# Patient Record
Sex: Male | Born: 1935 | Hispanic: Yes | Marital: Married
Health system: Southern US, Community
[De-identification: ages and names within clinical notes are randomized; demographics above are authoritative.]

---

## 2005-02-01 ENCOUNTER — Ambulatory Visit: Payer: Self-pay | Admitting: Gastroenterology

## 2008-04-08 ENCOUNTER — Emergency Department: Payer: Self-pay | Admitting: Internal Medicine

## 2010-08-30 ENCOUNTER — Ambulatory Visit: Payer: Self-pay | Admitting: Internal Medicine

## 2012-06-26 ENCOUNTER — Ambulatory Visit: Payer: Self-pay | Admitting: Gastroenterology

## 2012-06-30 LAB — PATHOLOGY REPORT

## 2013-09-14 ENCOUNTER — Ambulatory Visit: Payer: Self-pay | Admitting: Urology

## 2013-09-14 LAB — BASIC METABOLIC PANEL
Anion Gap: 8 (ref 7–16)
BUN: 25 mg/dL — ABNORMAL HIGH (ref 7–18)
CALCIUM: 9 mg/dL (ref 8.5–10.1)
CO2: 28 mmol/L (ref 21–32)
Chloride: 104 mmol/L (ref 98–107)
Creatinine: 1.24 mg/dL (ref 0.60–1.30)
EGFR (Non-African Amer.): 55 — ABNORMAL LOW
Glucose: 127 mg/dL — ABNORMAL HIGH (ref 65–99)
OSMOLALITY: 285 (ref 275–301)
Potassium: 4.6 mmol/L (ref 3.5–5.1)
Sodium: 140 mmol/L (ref 136–145)

## 2013-09-14 LAB — CBC
HCT: 37.1 % — ABNORMAL LOW (ref 40.0–52.0)
HGB: 12.4 g/dL — ABNORMAL LOW (ref 13.0–18.0)
MCH: 30.5 pg (ref 26.0–34.0)
MCHC: 33.4 g/dL (ref 32.0–36.0)
MCV: 91 fL (ref 80–100)
Platelet: 260 10*3/uL (ref 150–440)
RBC: 4.07 10*6/uL — AB (ref 4.40–5.90)
RDW: 13.1 % (ref 11.5–14.5)
WBC: 8 10*3/uL (ref 3.8–10.6)

## 2013-09-27 ENCOUNTER — Ambulatory Visit: Payer: Self-pay | Admitting: Urology

## 2014-05-28 NOTE — Op Note (Signed)
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   PROCEDURE: KTP laser prostatectomy.   SURGEON: Lorraine Laxichard D Sedonia Kitner, MD   ANESTHESIA: General.   COMPLICATIONS: None.   BLOOD LOSS: Zero.  DESCRIPTION OF PROCEDURE: With the patient sterilely prepped and draped in supine lithotomy position for ease of approach to the external genitalia and after an appropriate timeout, we do a cystoscopy, find trilobar hypertrophy, identify the verumontanum and the bladder neck. There is a prominent middle lobe with lateral lobe hypertrophy extending to the verumontanum. The verumontanum is easily seen. So, I used 146.864 joules with watts of 80 and 120 and lasing time is 23 minutes and 12 seconds. The middle lobe was done first, then the lateral lobes all the way to the verumontanum. This gland is perfect for KTP, it is very vascular and it easily absorbs the laser energy and beautifully necrosis. There is a beautiful opening at the bladder neck. Upon crede at the end of procedure after the scope has been withdrawn, I have an excellent stream. He has clear urine, but I do leave a Foley in. I was very meticulous in making sure we did not put any energy beyond the verumontanum. There was 1 small bleeder in the whole case and that was easily destroyed by the KTP laser on both coag and necrosing settings. At the end of the procedure, the bladder is emptied by utilizing a 20 French Foley; 30 mL of Marcaine 0.5% plain are placed in the bladder, a B and O suppository in the rectal. Rectal exam reveals no tumors, masses or growths. Probably I can discontinue the Foley before his discharge, as there is no bleeding.     ____________________________ Caralyn Guileichard D. Edwyna ShellHart, DO rdh:TT D: 09/27/2013 12:33:13 ET T: 09/27/2013 21:59:25 ET JOB#: [id number]  cc: Caralyn Guileichard D. Edwyna ShellHart, DO, <Dictator> Maleigh Bagot D Seann Genther DO ELECTRONICALLY SIGNED 10/01/2013 14:15

## 2019-03-08 ENCOUNTER — Inpatient Hospital Stay (HOSPITAL_COMMUNITY)
Admit: 2019-03-08 | Discharge: 2019-03-08 | Disposition: A | Discharge status: Home / Self Care | Payer: Medicare Other | Attending: Critical Care Medicine | Admitting: Critical Care Medicine

## 2019-03-08 ENCOUNTER — Other Ambulatory Visit: Payer: Self-pay

## 2019-03-08 ENCOUNTER — Inpatient Hospital Stay
Admission: EM | Admit: 2019-03-08 | Discharge: 2019-04-05 | DRG: 208 | Disposition: E | Payer: Medicare Other | Source: Skilled Nursing Facility | Attending: Internal Medicine | Admitting: Internal Medicine

## 2019-03-08 ENCOUNTER — Emergency Department: Payer: Medicare Other

## 2019-03-08 ENCOUNTER — Inpatient Hospital Stay: Payer: Medicare Other

## 2019-03-08 DIAGNOSIS — R627 Adult failure to thrive: Secondary | ICD-10-CM | POA: Diagnosis present

## 2019-03-08 DIAGNOSIS — J189 Pneumonia, unspecified organism: Secondary | ICD-10-CM | POA: Diagnosis not present

## 2019-03-08 DIAGNOSIS — Z95828 Presence of other vascular implants and grafts: Secondary | ICD-10-CM

## 2019-03-08 DIAGNOSIS — R1310 Dysphagia, unspecified: Secondary | ICD-10-CM | POA: Diagnosis present

## 2019-03-08 DIAGNOSIS — T17918D Gastric contents in respiratory tract, part unspecified causing other injury, subsequent encounter: Secondary | ICD-10-CM | POA: Diagnosis not present

## 2019-03-08 DIAGNOSIS — Z20822 Contact with and (suspected) exposure to covid-19: Secondary | ICD-10-CM | POA: Diagnosis present

## 2019-03-08 DIAGNOSIS — I495 Sick sinus syndrome: Secondary | ICD-10-CM | POA: Diagnosis present

## 2019-03-08 DIAGNOSIS — Z79899 Other long term (current) drug therapy: Secondary | ICD-10-CM

## 2019-03-08 DIAGNOSIS — A419 Sepsis, unspecified organism: Secondary | ICD-10-CM

## 2019-03-08 DIAGNOSIS — R339 Retention of urine, unspecified: Secondary | ICD-10-CM | POA: Diagnosis present

## 2019-03-08 DIAGNOSIS — G9389 Other specified disorders of brain: Secondary | ICD-10-CM | POA: Diagnosis present

## 2019-03-08 DIAGNOSIS — J9601 Acute respiratory failure with hypoxia: Principal | ICD-10-CM

## 2019-03-08 DIAGNOSIS — R791 Abnormal coagulation profile: Secondary | ICD-10-CM | POA: Diagnosis present

## 2019-03-08 DIAGNOSIS — G4700 Insomnia, unspecified: Secondary | ICD-10-CM | POA: Diagnosis present

## 2019-03-08 DIAGNOSIS — Z66 Do not resuscitate: Secondary | ICD-10-CM | POA: Diagnosis present

## 2019-03-08 DIAGNOSIS — I10 Essential (primary) hypertension: Secondary | ICD-10-CM | POA: Diagnosis present

## 2019-03-08 DIAGNOSIS — R4182 Altered mental status, unspecified: Secondary | ICD-10-CM | POA: Diagnosis not present

## 2019-03-08 DIAGNOSIS — E1151 Type 2 diabetes mellitus with diabetic peripheral angiopathy without gangrene: Secondary | ICD-10-CM | POA: Diagnosis present

## 2019-03-08 DIAGNOSIS — I69398 Other sequelae of cerebral infarction: Secondary | ICD-10-CM

## 2019-03-08 DIAGNOSIS — Z515 Encounter for palliative care: Secondary | ICD-10-CM | POA: Diagnosis present

## 2019-03-08 DIAGNOSIS — I69391 Dysphagia following cerebral infarction: Secondary | ICD-10-CM

## 2019-03-08 DIAGNOSIS — Z888 Allergy status to other drugs, medicaments and biological substances status: Secondary | ICD-10-CM

## 2019-03-08 DIAGNOSIS — R6520 Severe sepsis without septic shock: Secondary | ICD-10-CM

## 2019-03-08 DIAGNOSIS — G9349 Other encephalopathy: Secondary | ICD-10-CM | POA: Diagnosis present

## 2019-03-08 DIAGNOSIS — I69351 Hemiplegia and hemiparesis following cerebral infarction affecting right dominant side: Secondary | ICD-10-CM | POA: Diagnosis not present

## 2019-03-08 DIAGNOSIS — Z794 Long term (current) use of insulin: Secondary | ICD-10-CM | POA: Diagnosis not present

## 2019-03-08 DIAGNOSIS — L89896 Pressure-induced deep tissue damage of other site: Secondary | ICD-10-CM | POA: Diagnosis present

## 2019-03-08 DIAGNOSIS — L89159 Pressure ulcer of sacral region, unspecified stage: Secondary | ICD-10-CM | POA: Diagnosis present

## 2019-03-08 DIAGNOSIS — Z950 Presence of cardiac pacemaker: Secondary | ICD-10-CM

## 2019-03-08 DIAGNOSIS — Z87440 Personal history of urinary (tract) infections: Secondary | ICD-10-CM

## 2019-03-08 DIAGNOSIS — Z7189 Other specified counseling: Secondary | ICD-10-CM | POA: Diagnosis not present

## 2019-03-08 DIAGNOSIS — Z86711 Personal history of pulmonary embolism: Secondary | ICD-10-CM

## 2019-03-08 DIAGNOSIS — I959 Hypotension, unspecified: Secondary | ICD-10-CM | POA: Diagnosis present

## 2019-03-08 DIAGNOSIS — Z86718 Personal history of other venous thrombosis and embolism: Secondary | ICD-10-CM

## 2019-03-08 DIAGNOSIS — J9600 Acute respiratory failure, unspecified whether with hypoxia or hypercapnia: Secondary | ICD-10-CM | POA: Diagnosis present

## 2019-03-08 DIAGNOSIS — J690 Pneumonitis due to inhalation of food and vomit: Secondary | ICD-10-CM | POA: Diagnosis present

## 2019-03-08 DIAGNOSIS — Z7982 Long term (current) use of aspirin: Secondary | ICD-10-CM | POA: Diagnosis not present

## 2019-03-08 DIAGNOSIS — Z6823 Body mass index (BMI) 23.0-23.9, adult: Secondary | ICD-10-CM

## 2019-03-08 DIAGNOSIS — J9690 Respiratory failure, unspecified, unspecified whether with hypoxia or hypercapnia: Secondary | ICD-10-CM

## 2019-03-08 DIAGNOSIS — Z7901 Long term (current) use of anticoagulants: Secondary | ICD-10-CM

## 2019-03-08 LAB — COMPREHENSIVE METABOLIC PANEL
ALT: 26 U/L (ref 0–44)
AST: 41 U/L (ref 15–41)
Albumin: 2.2 g/dL — ABNORMAL LOW (ref 3.5–5.0)
Alkaline Phosphatase: 86 U/L (ref 38–126)
Anion gap: 7 (ref 5–15)
BUN: 25 mg/dL — ABNORMAL HIGH (ref 8–23)
CO2: 22 mmol/L (ref 22–32)
Calcium: 7.6 mg/dL — ABNORMAL LOW (ref 8.9–10.3)
Chloride: 114 mmol/L — ABNORMAL HIGH (ref 98–111)
Creatinine, Ser: 1.24 mg/dL (ref 0.61–1.24)
GFR calc Af Amer: >60 mL/min (ref >60)
GFR calc non Af Amer: 53 mL/min — ABNORMAL LOW (ref >60)
Glucose, Bld: 214 mg/dL — ABNORMAL HIGH (ref 70–99)
Potassium: 3.6 mmol/L (ref 3.5–5.1)
Sodium: 143 mmol/L (ref 135–145)
Total Bilirubin: 0.5 mg/dL (ref 0.3–1.2)
Total Protein: 6.2 g/dL — ABNORMAL LOW (ref 6.5–8.1)

## 2019-03-08 LAB — BLOOD GAS, ARTERIAL
Acid-base deficit: 2 mmol/L (ref 0.0–2.0)
Bicarbonate: 22.3 mmol/L (ref 20.0–28.0)
FIO2: 50
MECHVT: 500 mL
Mechanical Rate: 14
O2 Saturation: 98.3 %
PEEP: 5 cmH2O
Patient temperature: 37
pCO2 arterial: 36 mmHg (ref 32.0–48.0)
pH, Arterial: 7.4 (ref 7.350–7.450)
pO2, Arterial: 111 mmHg — ABNORMAL HIGH (ref 83.0–108.0)

## 2019-03-08 LAB — CBC WITH DIFFERENTIAL/PLATELET
Abs Immature Granulocytes: 0.07 10*3/uL (ref 0.00–0.07)
Basophils Absolute: 0.1 10*3/uL (ref 0.0–0.1)
Basophils Relative: 0 %
Eosinophils Absolute: 0 10*3/uL (ref 0.0–0.5)
Eosinophils Relative: 0 %
HCT: 34.5 % — ABNORMAL LOW (ref 39.0–52.0)
Hemoglobin: 11.4 g/dL — ABNORMAL LOW (ref 13.0–17.0)
Immature Granulocytes: 1 %
Lymphocytes Relative: 10 %
Lymphs Abs: 1.5 10*3/uL (ref 0.7–4.0)
MCH: 29.6 pg (ref 26.0–34.0)
MCHC: 33 g/dL (ref 30.0–36.0)
MCV: 89.6 fL (ref 80.0–100.0)
Monocytes Absolute: 1.3 10*3/uL — ABNORMAL HIGH (ref 0.1–1.0)
Monocytes Relative: 9 %
Neutro Abs: 12.5 10*3/uL — ABNORMAL HIGH (ref 1.7–7.7)
Neutrophils Relative %: 80 %
Platelets: 321 10*3/uL (ref 150–400)
RBC: 3.85 MIL/uL — ABNORMAL LOW (ref 4.22–5.81)
RDW: 13.7 % (ref 11.5–15.5)
WBC: 15.5 10*3/uL — ABNORMAL HIGH (ref 4.0–10.5)
nRBC: 0 % (ref 0.0–0.2)

## 2019-03-08 LAB — MRSA PCR SCREENING: MRSA by PCR: NEGATIVE

## 2019-03-08 LAB — C-REACTIVE PROTEIN: CRP: 24.5 mg/dL — ABNORMAL HIGH (ref <1.0)

## 2019-03-08 LAB — PROTIME-INR
INR: 3.4 — ABNORMAL HIGH (ref 0.8–1.2)
Prothrombin Time: 34.4 seconds — ABNORMAL HIGH (ref 11.4–15.2)

## 2019-03-08 LAB — GLUCOSE, CAPILLARY
Glucose-Capillary: 239 mg/dL — ABNORMAL HIGH (ref 70–99)
Glucose-Capillary: 258 mg/dL — ABNORMAL HIGH (ref 70–99)
Glucose-Capillary: 231 mg/dL — ABNORMAL HIGH (ref 70–99)
Glucose-Capillary: 173 mg/dL — ABNORMAL HIGH (ref 70–99)
Glucose-Capillary: 185 mg/dL — ABNORMAL HIGH (ref 70–99)

## 2019-03-08 LAB — RESPIRATORY PANEL BY RT PCR (FLU A&B, COVID)
Influenza A by PCR: NEGATIVE
Influenza B by PCR: NEGATIVE
SARS Coronavirus 2 by RT PCR: NEGATIVE

## 2019-03-08 LAB — FIBRIN DERIVATIVES D-DIMER (ARMC ONLY): Fibrin derivatives D-dimer (ARMC): 1518.45 ng/mL (FEU) — ABNORMAL HIGH (ref 0.00–499.00)

## 2019-03-08 LAB — BRAIN NATRIURETIC PEPTIDE: B Natriuretic Peptide: 534 pg/mL — ABNORMAL HIGH (ref 0.0–100.0)

## 2019-03-08 LAB — LACTIC ACID, PLASMA
Lactic Acid, Venous: 1 mmol/L (ref 0.5–1.9)
Lactic Acid, Venous: 2.2 mmol/L (ref 0.5–1.9)

## 2019-03-08 LAB — APTT: aPTT: 64 seconds — ABNORMAL HIGH (ref 24–36)

## 2019-03-08 LAB — HEMOGLOBIN A1C
Hgb A1c MFr Bld: 8.1 % — ABNORMAL HIGH (ref 4.8–5.6)
Mean Plasma Glucose: 185.77 mg/dL

## 2019-03-08 LAB — TROPONIN I (HIGH SENSITIVITY): Troponin I (High Sensitivity): 3219 ng/L (ref <18)

## 2019-03-08 LAB — FERRITIN: Ferritin: 259 ng/mL (ref 24–336)

## 2019-03-08 LAB — PROCALCITONIN: Procalcitonin: 0.34 ng/mL

## 2019-03-08 LAB — POC SARS CORONAVIRUS 2 AG: SARS Coronavirus 2 Ag: NEGATIVE

## 2019-03-08 MED ORDER — SODIUM CHLORIDE 0.9 % IV SOLN
2.0000 g | Freq: Once | INTRAVENOUS | Status: AC
  Administered 2019-03-08: 04:00:00 2 g via INTRAVENOUS
  Filled 2019-03-08: qty 2

## 2019-03-08 MED ORDER — ATORVASTATIN CALCIUM 20 MG PO TABS
80.0000 mg | ORAL_TABLET | Freq: Every evening | ORAL | Status: DC
  Administered 2019-03-08 – 2019-03-09 (×2): 80 mg
  Filled 2019-03-08 (×2): qty 4

## 2019-03-08 MED ORDER — METRONIDAZOLE IN NACL 5-0.79 MG/ML-% IV SOLN
500.0000 mg | Freq: Once | INTRAVENOUS | Status: AC
  Administered 2019-03-08: 500 mg via INTRAVENOUS
  Filled 2019-03-08: qty 100

## 2019-03-08 MED ORDER — INSULIN ASPART 100 UNIT/ML ~~LOC~~ SOLN
0.0000 [IU] | SUBCUTANEOUS | Status: DC
  Administered 2019-03-08: 8 [IU] via SUBCUTANEOUS
  Administered 2019-03-08 (×2): 3 [IU] via SUBCUTANEOUS
  Administered 2019-03-09: 2 [IU] via SUBCUTANEOUS
  Administered 2019-03-09: 5 [IU] via SUBCUTANEOUS
  Administered 2019-03-09: 3 [IU] via SUBCUTANEOUS
  Administered 2019-03-09: 17:00:00 2 [IU] via SUBCUTANEOUS
  Administered 2019-03-09: 3 [IU] via SUBCUTANEOUS
  Filled 2019-03-08 (×9): qty 1

## 2019-03-08 MED ORDER — METOPROLOL TARTRATE 25 MG PO TABS: 12.5000 mg | ORAL_TABLET | Freq: Four times a day (QID) | ORAL | Status: DC

## 2019-03-08 MED ORDER — CHLORHEXIDINE GLUCONATE CLOTH 2 % EX PADS
6.0000 | MEDICATED_PAD | Freq: Every day | CUTANEOUS | Status: DC
  Administered 2019-03-08 – 2019-03-13 (×5): 6 via TOPICAL

## 2019-03-08 MED ORDER — LIDOCAINE HCL URETHRAL/MUCOSAL 2 % EX GEL
1.0000 "application " | Freq: Once | CUTANEOUS | Status: AC
  Administered 2019-03-08: 1 via URETHRAL
  Filled 2019-03-08: qty 5

## 2019-03-08 MED ORDER — DEXAMETHASONE SODIUM PHOSPHATE 10 MG/ML IJ SOLN
10.0000 mg | Freq: Once | INTRAMUSCULAR | Status: AC
  Administered 2019-03-08: 10 mg via INTRAVENOUS
  Filled 2019-03-08: qty 1

## 2019-03-08 MED ORDER — ROCURONIUM BROMIDE 50 MG/5ML IV SOLN
120.0000 mg | Freq: Once | INTRAVENOUS | Status: AC
  Administered 2019-03-08: 120 mg via INTRAVENOUS

## 2019-03-08 MED ORDER — ASPIRIN 81 MG PO CHEW: 81.0000 mg | CHEWABLE_TABLET | Freq: Every day | ORAL | Status: DC

## 2019-03-08 MED ORDER — ATORVASTATIN CALCIUM 20 MG PO TABS: 80.0000 mg | ORAL_TABLET | Freq: Every evening | ORAL | Status: DC

## 2019-03-08 MED ORDER — IPRATROPIUM-ALBUTEROL 0.5-2.5 (3) MG/3ML IN SOLN: 3.0000 mL | Freq: Four times a day (QID) | RESPIRATORY_TRACT | Status: DC | PRN

## 2019-03-08 MED ORDER — LACTATED RINGERS IV SOLN: INTRAVENOUS | Status: DC

## 2019-03-08 MED ORDER — FENTANYL CITRATE (PF) 100 MCG/2ML IJ SOLN
12.5000 ug | INTRAMUSCULAR | Status: DC | PRN
  Administered 2019-03-09 (×2): 12.5 ug via INTRAVENOUS
  Filled 2019-03-08 (×2): qty 2

## 2019-03-08 MED ORDER — METOPROLOL TARTRATE 25 MG PO TABS: 12.5000 mg | ORAL_TABLET | Freq: Two times a day (BID) | ORAL | Status: DC

## 2019-03-08 MED ORDER — ETOMIDATE 2 MG/ML IV SOLN
30.0000 mg | Freq: Once | INTRAVENOUS | Status: AC
  Administered 2019-03-08: 30 mg via INTRAVENOUS

## 2019-03-08 MED ORDER — FAMOTIDINE IN NACL 20-0.9 MG/50ML-% IV SOLN
20.0000 mg | Freq: Every day | INTRAVENOUS | Status: DC
  Administered 2019-03-08 – 2019-03-11 (×4): 20 mg via INTRAVENOUS
  Filled 2019-03-08 (×4): qty 50

## 2019-03-08 MED ORDER — ORAL CARE MOUTH RINSE
15.0000 mL | OROMUCOSAL | Status: DC
  Administered 2019-03-08 – 2019-03-13 (×47): 15 mL via OROMUCOSAL

## 2019-03-08 MED ORDER — ASPIRIN 81 MG PO CHEW
81.0000 mg | CHEWABLE_TABLET | Freq: Every day | ORAL | Status: DC
  Administered 2019-03-09 – 2019-03-11 (×3): 81 mg
  Filled 2019-03-08 (×3): qty 1

## 2019-03-08 MED ORDER — VANCOMYCIN HCL IN DEXTROSE 1-5 GM/200ML-% IV SOLN
1000.0000 mg | Freq: Once | INTRAVENOUS | Status: AC
  Administered 2019-03-08: 1000 mg via INTRAVENOUS
  Filled 2019-03-08: qty 200

## 2019-03-08 MED ORDER — ONDANSETRON HCL 4 MG/2ML IJ SOLN: 4.0000 mg | Freq: Four times a day (QID) | INTRAMUSCULAR | Status: DC | PRN

## 2019-03-08 MED ORDER — LACTATED RINGERS IV BOLUS
1000.0000 mL | Freq: Once | INTRAVENOUS | Status: AC
  Administered 2019-03-08: 04:00:00 1000 mL via INTRAVENOUS

## 2019-03-08 MED ORDER — PROPOFOL 1000 MG/100ML IV EMUL
5.0000 ug/kg/min | INTRAVENOUS | Status: DC
  Administered 2019-03-08: 02:00:00 5 ug/kg/min via INTRAVENOUS
  Administered 2019-03-08 (×2): 25 ug/kg/min via INTRAVENOUS
  Filled 2019-03-08 (×2): qty 100

## 2019-03-08 MED ORDER — SODIUM CHLORIDE 0.9 % IV SOLN
2.0000 g | Freq: Two times a day (BID) | INTRAVENOUS | Status: DC
  Administered 2019-03-08 – 2019-03-11 (×6): 2 g via INTRAVENOUS
  Filled 2019-03-08 (×8): qty 2

## 2019-03-08 MED ORDER — LACTATED RINGERS IV BOLUS (SEPSIS)
1000.0000 mL | Freq: Once | INTRAVENOUS | Status: AC
  Administered 2019-03-08: 1000 mL via INTRAVENOUS

## 2019-03-08 MED ORDER — METHYLPREDNISOLONE SODIUM SUCC 40 MG IJ SOLR
40.0000 mg | Freq: Two times a day (BID) | INTRAMUSCULAR | Status: DC
  Administered 2019-03-08 – 2019-03-11 (×7): 40 mg via INTRAVENOUS
  Filled 2019-03-08 (×7): qty 1

## 2019-03-08 MED ORDER — CHLORHEXIDINE GLUCONATE 0.12% ORAL RINSE (MEDLINE KIT)
15.0000 mL | Freq: Two times a day (BID) | OROMUCOSAL | Status: DC
  Administered 2019-03-08: 15 mL via OROMUCOSAL

## 2019-03-08 NOTE — Progress Notes (Signed)
CODE SEPSIS - PHARMACY COMMUNICATION  **Broad Spectrum Antibiotics should be administered within 1 hour of Sepsis diagnosis**  Time Code Sepsis Called/Page Received: 0148  Antibiotics Ordered: vanc/cefepime/flagyl  Time of 1st antibiotic administration: 0238 (vanc/flagyl)  Additional action taken by pharmacy: called RN to see status of cefepime and RN had stated that patient needed a new line to run additional meds. Vanc/flagyl were given first because RN did not realize to give cefepime first.  If necessary, Name of Provider/Nurse Contacted:     Thomasene Ripple ,PharmD Clinical Pharmacist  03/24/2019  3:22 AM

## 2019-03-08 NOTE — ED Notes (Signed)
RT at bedside suctioning pt at this time. Pt waking up some but able to talk to pt and he is now resting with eyes closed.

## 2019-03-08 NOTE — Progress Notes (Signed)
Inpatient Diabetes Program Recommendations  AACE/ADA: New Consensus Statement on Inpatient Glycemic Control (2015)  Target Ranges:  Prepandial:   less than 140 mg/dL      Peak postprandial:   less than 180 mg/dL (1-2 hours)      Critically ill patients:  140 - 180 mg/dL   Lab Results  Component Value Date   GLUCAP 231 (H) 03/14/2019   HGBA1C 8.1 (H) 03/18/2019    Review of Glycemic Control Results for Russell Patrick, Russell Patrick (MRN [id number]) as of 03/28/2019 11:19  Ref. Range 03/23/2019 06:55 03/28/2019 08:35 04/04/2019 10:17  Glucose-Capillary Latest Ref Range: 70 - 99 mg/dL 301 (H) 314 (H) 388 (H)   Diabetes history: DM 2 Outpatient Diabetes medications: Glipizide 10 mg bid, Lantus 18 units qevening, Regular insulin 0-10 units 4x/day Current orders for Inpatient glycemic control:  Novolog 0-15 units Q4 hours  Solumedrol 40 mg Q12 hours  Inpatient Diabetes Program Recommendations:    Pt may benefit from Levemir 5-6 units bid.  Thanks,  Christena Deem RN, MSN, BC-ADM Inpatient Diabetes Coordinator Team Pager [phone] (8a-5p)

## 2019-03-08 NOTE — ED Notes (Addendum)
Pt intubated by Md jessup.

## 2019-03-08 NOTE — ED Notes (Signed)
Per off going RN Mac pt is waiting for ICU bed at this time. Pt still needs urine and waiting on urology to come and insert foley. Mac states several attempts with no success.

## 2019-03-08 NOTE — Progress Notes (Signed)
*  PRELIMINARY RESULTS* Echocardiogram 2D Echocardiogram has been performed.  Cristela Blue 03/23/2019, 2:26 PM

## 2019-03-08 NOTE — H&P (Signed)
Name: Russell Patrick MRN: [id number] DOB: 1932-10-16    ADMISSION DATE:  03/14/2019 CONSULTATION DATE: 03/08/2019  REFERRING MD : Dr. Charna Archer   CHIEF COMPLAINT: Shortness of Breath   BRIEF PATIENT DESCRIPTION:  86 yo male admitted with acute hypoxic respiratory failure suspected secondary to pneumonia and acute encephalopathy due to severe hypoxia requiring mechanical intubation   SIGNIFICANT EVENTS/STUDIES:  02/1: Pt admitted to ICU with acute hypoxic respiratory failure requiring mechanical intubation   HISTORY OF PRESENT ILLNESS:   This is an 86 yo male with a PMH of CVA with Right Sided Deficits, DVT/PE requiring IVC filter, PAD, Insomnia, HTN, Recurrent UTI's, Type II Diabetes Mellitus, Recurrent Syncopal Episodes, and Pacemaker.  He presented to [facility] ER via EMS from [facility] on 02/1 with hypoxia, O2 sats in the low 80's. Per ER notes according to the facility pt tested positive for COVID-19 via rapid test on 01/31. EMS reported upon their arrival pt hypoxic requiring NRB and he also received 500 ml fluid bolus due to inability to obtain blood pressure en route to the ER.  It was also reported pt febrile with temp of 103 F. Upon arrival to the ER pt obtunded, tachycardic, and tachypneic requiring mechanical intubation.  Lab results revealed glucose 214, BNP 534, lactic acid 1.0, pct 0.34, wbc 15.5, hgb 11.4, PT 34.4, and INR 3.4. Influenza PCR and COVID-19 negative, however CXR concerning for bibasilar infiltrates.  Pt received cefepime, decadron, flagyl, vancomycin, and 2L LR bolus.  He was subsequently admitted to ICU for additional workup and treatment.    PAST MEDICAL HISTORY :   has no past medical history on file.  has no past surgical history on file. Prior to Admission medications   Medication Sig Start Date End Date Taking? Authorizing Provider  acetaminophen (TYLENOL) 500 MG tablet Take 1,000 mg by mouth every 6 (six) hours as needed.   Yes [provider]    aspirin 81 MG chewable tablet Chew 81 mg by mouth daily.  02/09/19 03/11/19 Yes [provider]  atorvastatin (LIPITOR) 80 MG tablet Take 1 tablet by mouth every evening. 02/09/19 03/11/19 Yes [provider]  clotrimazole-betamethasone (LOTRISONE) cream Apply 1 application topically 2 (two) times daily.  10/16/18  Yes [provider]  escitalopram (LEXAPRO) 10 MG tablet Take 10 mg by mouth daily. 10/26/18  Yes [provider]  famotidine (PEPCID) 20 MG tablet Take 20 mg by mouth at bedtime.   Yes [provider]  glipiZIDE (GLUCOTROL XL) 10 MG 24 hr tablet Take 10 mg by mouth 2 (two) times daily. 11/07/18  Yes [provider]  insulin glargine (LANTUS) 100 UNIT/ML injection Inject 18 Units into the skin every evening. 02/08/19 03/10/19 Yes [provider]  lidocaine (XYLOCAINE) 5 % ointment Apply 1 application topically 3 (three) times daily.  11/27/18  Yes [provider]  lisinopril (ZESTRIL) 10 MG tablet Take 1 tablet by mouth daily. 02/09/19 03/11/19 Yes [provider]  Melatonin 3 MG TABS Take 1 tablet by mouth at bedtime. 02/08/19  Yes [provider]  metoprolol tartrate (LOPRESSOR) 25 MG tablet Take 12.5 mg by mouth every 6 (six) hours. 02/08/19 03/10/19 Yes [provider]  pantoprazole (PROTONIX) 40 MG tablet Take 40 mg by mouth every morning. 11/30/18  Yes [provider]  polyethylene glycol powder (GLYCOLAX/MIRALAX) 17 GM/SCOOP powder Take 17 g by mouth daily. Mix in 4 oz fluid 02/09/19 03/11/19 Yes [provider]  senna (SENOKOT) 8.6 MG tablet Take  1 tablet by mouth daily as needed. 02/08/19 03/10/19 Yes [provider]  silver sulfADIAZINE (SILVADENE) 1 % cream Apply 1 application topically daily. Apply a small amount topically to open areas daily 04/13/18 04/13/19 Yes [provider]  traZODone (DESYREL) 50 MG tablet Take 50 mg by mouth at bedtime. 11/10/18  Yes [provider]  XARELTO 20 MG TABS tablet Take 20 mg by mouth every evening. With meal 12/21/18  Yes [provider]  insulin regular (NOVOLIN R) 100 units/mL injection Inject 0-10 Units into the skin 4 (four) times daily -  before meals and at bedtime. Per sliding scale; If blood sugar is less than 60, call MD. If blood sugar is 0 to 150, give 0 units. If blood sugar is 151 to 250, give 2 units. If blood sugar is 251 to 300, give 4 units. If blood sugar is 301 to 350, give 6 units. If blood sugar is 351 to 400, give 8 units. If blood sugar is 401 to 450, give 10 units. If blood sugar is greater than 450, call MD. (if BS>350, give 10 units and recheck in 2 hours, call MD)    [provider]   Allergies  Allergen Reactions  . Statins     FAMILY HISTORY:  family history is not on file. SOCIAL HISTORY:    REVIEW OF SYSTEMS:   Unable to assess pt intubated   SUBJECTIVE:  Unable to assess pt intubated   VITAL SIGNS: Temp:  [99.2 F (37.3 C)] 99.2 F (37.3 C) (02/01 0225) Pulse Rate:  [98-134] 98 (02/01 0330) Resp:  [17-38] 19 (02/01 0330) BP: (154-175)/(74-111) 161/74 (02/01 0330) SpO2:  [98 %-100 %] 100 % (02/01 0330) FiO2 (%):  [50 %] 50 % (02/01 0149) Weight:  [82 kg] 82 kg (02/01 0149)  PHYSICAL EXAMINATION: General: acutely ill appearing male, NAD mechanically intubated Neuro: sedated, not following commands  HEENT: supple, no JVD Cardiovascular: sinus tach, BBB, no R/G Lungs: rhonchi throughout, even, non labored  Abdomen: +BS x4, obese, soft, non tender, non distended Musculoskeletal: normal bulk and tone, no edema Skin: intact no rashes or lesions   Recent Labs  Lab 03/09/2019 0148  NA 143  K 3.6  CL 114*  CO2 22  BUN 25*  CREATININE 1.24  GLUCOSE 214*   Recent Labs  Lab 04/02/2019 0148  HGB 11.4*  HCT 34.5*  WBC 15.5*  PLT 321   DG Chest Port 1 View  Result Date: 04/04/2019 CLINICAL DATA:  Check endotracheal tube placement EXAM: PORTABLE CHEST 1 VIEW  COMPARISON:  09/14/2013 FINDINGS: Cardiac shadow is within normal limits. Endotracheal tube is seen in satisfactory position. Gastric catheter is noted with the tip in the stomach although the proximal side port lies at the GE junction. This could be advanced several cm further into the stomach. Lungs demonstrate patchy bibasilar infiltrates left greater than right. No bony abnormality is seen. IMPRESSION: Bibasilar infiltrates. Tubes and lines as described. Electronically Signed   By: Inez Catalina M.D.   On: 04/04/2019 02:18    ASSESSMENT / PLAN:  Acute hypoxic respiratory failure secondary to pneumonia  Mechanical Intubation  Full vent support for now-vent settings reviewed and established  SBT once parameters met  VAP bundle implemented  IV steroids wean as tolerated  Prn bronchodilator therapy   Essential HTN  Hx: DVT/PE requiring IVC filter, CVA with Right Sided Deficits, Pacemaker, and Recurrent Syncopal Episodes  Continuous telemetry monitoring  Will check troponin  Echo pending  Continue outpatient metoprolol, aspirin, and atorvastatin  Hold outpatient xarelto, metoprolol, and lisinopril for now   Leukocytosis secondary to pneumonia  Trend WBC and monitor fever curve Trend PCT  Follow cultures  Continue cefepime   Urinary retention  Urology consulted to place foley catheter due to unsuccessful placement by nursing staff appreciate input   Type II Diabetes Mellitus  CBG's q4hrs  SSI   Acute encephalopathy likely secondary to hypoxia  Mechanical intubation pain/discomfort Maintain RASS goal -1 to -2 Propofol gtt and prn fentanyl to maintain RASS goal  WUA daily  CT Head pending   Best Practice: VTE px: SCD's, will hold chemical prophylaxis for now due to elevated PT/INR  SUP px: iv famotidine Diet: keep NPO for now if pt remains intubated in the next 24hrs will start tube feeds   Marda Stalker, [place] Pager [phone] (please enter 7  digits) PCCM Consult Pager [phone] (please enter 7 digits)

## 2019-03-08 NOTE — ED Notes (Signed)
Pt given 30 etomidate by Melina Copa

## 2019-03-08 NOTE — Progress Notes (Signed)
PHARMACY -  BRIEF ANTIBIOTIC NOTE   Pharmacy has received consult(s) for vanc/cefepime from an ED provider.  The patient's profile has been reviewed for ht/wt/allergies/indication/available labs.    One time order(s) placed for vancomycin 2g IV load (vanc 1g + 1g)  Further antibiotics/pharmacy consults should be ordered by admitting physician if indicated.                       Thank you,  Thomasene Ripple, PharmD, BCPS Clinical Pharmacist 03/15/2019  3:26 AM

## 2019-03-08 NOTE — Progress Notes (Signed)
Transported to ct scan without incident.

## 2019-03-08 NOTE — ED Notes (Addendum)
ET tube 25 at the lip, verified by CO2 color change.

## 2019-03-08 NOTE — ED Notes (Signed)
Admit MD Jayme Cloud informed of troponin level.

## 2019-03-08 NOTE — ED Provider Notes (Signed)
[facility] Emergency Department Provider Note   ____________________________________________   First MD Initiated Contact with Patient 03/13/2019 0145     (approximate)  I have reviewed the triage vital signs and the nursing notes.   HISTORY  Chief Complaint Respiratory Distress    HPI Russell Patrick is a 86 y.o. male with past medical history of stroke with right-sided deficits, hypertension, diabetes, PAD presents to the ED for respiratory distress.  History is limited due to patient's diminished mental status.  Patient resides [facility] and was noted to have increasing trouble breathing as well as a fever earlier today.  He reportedly had a rapid Covid test at the facility that returned positive.  EMS was called due to worsening difficulty breathing, and upon arrival he was found to be in respiratory distress with O2 sat in the low 80s.  He was subsequently placed on nonrebreather at 15 L and had improvement in O2 sats to 90%.  He was noted to be tachycardic, tachypneic, and febrile to 103.  EMS had difficulty obtaining a blood pressure and he was given a fluid bolus of 500 cc prior to arrival.        History reviewed. No pertinent past medical history.  Patient Active Problem List   Diagnosis Date Noted  . Acute respiratory failure (HCC) 04/02/2019    History reviewed. No pertinent surgical history.  Prior to Admission medications   Medication Sig Start Date End Date Taking? Authorizing Provider  acetaminophen (TYLENOL) 500 MG tablet Take 1,000 mg by mouth every 6 (six) hours as needed.   Yes [provider]  aspirin 81 MG chewable tablet Chew 1 tablet by mouth daily. 02/09/19 03/11/19 Yes [provider]  atorvastatin (LIPITOR) 80 MG tablet Take 1 tablet by mouth every evening. 02/09/19 03/11/19 Yes [provider]  famotidine (PEPCID) 20 MG tablet Take 20 mg by mouth at bedtime.   Yes [provider]  insulin  glargine (LANTUS) 100 UNIT/ML injection Inject 18 Units into the skin every evening. 02/08/19 03/10/19 Yes [provider]  lisinopril (ZESTRIL) 10 MG tablet Take 1 tablet by mouth daily. 02/09/19 03/11/19 Yes [provider]  Melatonin 3 MG TABS Take 1 tablet by mouth at bedtime. 02/08/19  Yes [provider]  metoprolol tartrate (LOPRESSOR) 25 MG tablet Take 12.5 mg by mouth every 6 (six) hours. 02/08/19 03/10/19 Yes [provider]  polyethylene glycol powder (GLYCOLAX/MIRALAX) 17 GM/SCOOP powder Take 17 g by mouth daily. Mix in 4 oz fluid 02/09/19 03/11/19 Yes [provider]  clotrimazole-betamethasone (LOTRISONE) cream APPLY CREAM TOPICALLY TWICE DAILY 10/16/18   [provider]  escitalopram (LEXAPRO) 10 MG tablet Take 10 mg by mouth daily. 10/26/18   [provider]  lidocaine (XYLOCAINE) 5 % ointment APPLY OINTMENT TOPICALLY THREE TIMES DAILY 11/27/18   [provider]  pantoprazole (PROTONIX) 40 MG tablet Take 40 mg by mouth every morning. 11/30/18   [provider]    Allergies Patient has no allergy information on record.  History reviewed. No pertinent family history.  Social History Social History   Tobacco Use  . Smoking status: Unknown If Ever Smoked  Substance Use Topics  . Alcohol use: Not on file  . Drug use: Not on file    Review of Systems Unable to obtain secondary to altered mental status  ____________________________________________   PHYSICAL EXAM:  VITAL SIGNS: ED Triage Vitals [03/21/2019 0136]  Enc Vitals Group     BP  Pulse Rate (!) 101     Resp (!) 24     Temp      Temp src      SpO2 100 %     Weight      Height      Head Circumference      Peak Flow      Pain Score      Pain Loc      Pain Edu?      Excl. in GC?     Constitutional: Obtunded. Eyes: Conjunctivae are normal.  Pupils equal round and reactive to light bilaterally. Head: Atraumatic. Nose: No  congestion/rhinnorhea. Mouth/Throat: Mucous membranes are moist. Neck: Normal ROM Cardiovascular: Tachycardic, regular rhythm. Grossly normal heart sounds. Respiratory: Tachypneic with accessory muscle use, crackles at bilateral bases. Gastrointestinal: Soft and nondistended. Genitourinary: deferred Musculoskeletal: No lower extremity tenderness nor edema. Neurologic: Does not open eyes or respond to voice.  Withdraws slightly from pain on left with no response to pain on right. Skin:  Skin is warm, dry and intact. No rash noted.  ____________________________________________   LABS (all labs ordered are listed, but only abnormal results are displayed)  Labs Reviewed  COMPREHENSIVE METABOLIC PANEL - Abnormal; Notable for the following components:      Result Value   Chloride 114 (*)    Glucose, Bld 214 (*)    BUN 25 (*)    Calcium 7.6 (*)    Total Protein 6.2 (*)    Albumin 2.2 (*)    GFR calc non Af Amer 53 (*)    All other components within normal limits  CBC WITH DIFFERENTIAL/PLATELET - Abnormal; Notable for the following components:   WBC 15.5 (*)    RBC 3.85 (*)    Hemoglobin 11.4 (*)    HCT 34.5 (*)    Neutro Abs 12.5 (*)    Monocytes Absolute 1.3 (*)    All other components within normal limits  APTT - Abnormal; Notable for the following components:   aPTT 64 (*)    All other components within normal limits  PROTIME-INR - Abnormal; Notable for the following components:   Prothrombin Time 34.4 (*)    INR 3.4 (*)    All other components within normal limits  BRAIN NATRIURETIC PEPTIDE - Abnormal; Notable for the following components:   B Natriuretic Peptide 534.0 (*)    All other components within normal limits  BLOOD GAS, ARTERIAL - Abnormal; Notable for the following components:   pO2, Arterial 111 (*)    All other components within normal limits  RESPIRATORY PANEL BY RT PCR (FLU A&B, COVID)  CULTURE, BLOOD (ROUTINE X 2)  CULTURE, BLOOD (ROUTINE X 2)  URINE  CULTURE  RESPIRATORY PANEL BY RT PCR (FLU A&B, COVID)  LACTIC ACID, PLASMA  PROCALCITONIN  LACTIC ACID, PLASMA  URINALYSIS, ROUTINE W REFLEX MICROSCOPIC  C-REACTIVE PROTEIN  FERRITIN  FIBRIN DERIVATIVES D-DIMER ([facility] ONLY)  POC SARS CORONAVIRUS 2 AG -  ED  POC SARS CORONAVIRUS 2 AG   ____________________________________________  EKG  ED ECG REPORT I, Chesley Noon, the attending physician, personally viewed and interpreted this ECG.   Date: 03/17/2019  EKG Time: 1:37  Rate: 99  Rhythm: normal sinus rhythm  Axis: LAD  Intervals:right bundle branch block and left anterior fascicular block  ST&T Change: None  ED ECG REPORT I, Chesley Noon, the attending physician, personally viewed and interpreted this ECG.   Date: Mar 17, 2019  EKG Time: 1:49  Rate: 121  Rhythm: sinus tachycardia  Axis: LAD  Intervals:right bundle branch block and left anterior fascicular block  ST&T Change: None    PROCEDURES  Procedure(s) performed (including Critical Care):  .Critical Care Performed by: Chesley Noon, MD Authorized by: Chesley Noon, MD   Critical care provider statement:    Critical care time (minutes):  45   Critical care time was exclusive of:  Separately billable procedures and treating other patients and teaching time   Critical care was necessary to treat or prevent imminent or life-threatening deterioration of the following conditions:  Respiratory failure   Critical care was time spent personally by me on the following activities:  Discussions with consultants, evaluation of patient's response to treatment, examination of patient, ordering and performing treatments and interventions, ordering and review of laboratory studies, ordering and review of radiographic studies, pulse oximetry, re-evaluation of patient's condition, obtaining history from patient or surrogate and review of old charts   I assumed direction of critical care for this patient from another provider  in my specialty: no   Procedure Name: Intubation Date/Time: 2019/03/19 3:42 AM Performed by: Chesley Noon, MD Pre-anesthesia Checklist: Patient identified, Patient being monitored, Emergency Drugs available, Timeout performed and Suction available Oxygen Delivery Method: Non-rebreather mask Preoxygenation: Pre-oxygenation with 100% oxygen Induction Type: Rapid sequence Ventilation: Mask ventilation without difficulty Laryngoscope Size: Glidescope and 4 Grade View: Grade I Tube size: 7.5 mm Number of attempts: 1 Airway Equipment and Method: Video-laryngoscopy Placement Confirmation: ETT inserted through vocal cords under direct vision,  CO2 detector and Breath sounds checked- equal and bilateral Secured at: 25 cm Tube secured with: ETT holder Dental Injury: Teeth and Oropharynx as per pre-operative assessment         ____________________________________________   INITIAL IMPRESSION / ASSESSMENT AND PLAN / ED COURSE       86 year old male with history of stroke and right-sided deficits, hypertension, diabetes presents to the ED for altered mental status and respiratory distress.  He was noted to have O2 sats in the 80s prior to arrival and was subsequently placed on nonrebreather.  Upon arrival in the ED, he is obtunded with no response to voice and minimal response to pain, GCS less than 8.  He is also in respiratory distress with tachypnea and accessory muscle use.  Decision was made to intubate and this was done without difficulty.  Given reported fever as well as tachycardia and tachypnea here, I am concerned for sepsis and he was started on broad-spectrum antibiotics.  COVID-19 testing pending.  His heart rate gradually improved with IV fluid administration.  Patient noted to have leukocytosis as well as elevated procalcitonin, consistent with sepsis.  COVID-19 testing thus far is negative.  There is evidence of pneumonia on chest x-ray and in talking with patient's son, he has  a history of recurrent UTI.  Unfortunately there has been difficulty placing a Foley catheter.  Coud catheter was placed, however there was no return of urine following placement despite flushing and catheter subsequently removed.  Assistance of urology may be needed for placement of catheter.  Case discussed with ICU team, who accepts patient for admission.      ____________________________________________   FINAL CLINICAL IMPRESSION(S) / ED DIAGNOSES  Final diagnoses:  Sepsis with acute hypoxic respiratory failure without septic shock, due to unspecified organism [facility])  Community acquired pneumonia, unspecified laterality  Altered mental status, unspecified altered mental status type     ED Discharge Orders    None       Note:  This document  was prepared using Systems analyst and may include unintentional dictation errors.   Blake Divine, MD 03-23-2019 [phone]

## 2019-03-08 NOTE — ED Notes (Addendum)
Verbal order for 30 Etomidate and 120 rocc from Md jessup.

## 2019-03-08 NOTE — ED Notes (Signed)
Pt bladder scanned. >254mL

## 2019-03-08 NOTE — Progress Notes (Signed)
   25-Mar-2019 2000  Clinical Encounter Type  Visited With Patient;Family  Visit Type Initial  Referral From Nurse  Consult/Referral To Chaplain  Spiritual Encounters  Spiritual Needs Prayer  Stress Factors  Patient Stress Factors Health changes;Major life changes  Family Stress Factors Loss;Loss of control;Major life changes  Chaplain visited with patient and family. Patient is transitioning to comfort care and family asked for prayer. Chaplain offered prayer to family for God's will to be done and for strength for the family per their request.  End of Visit.

## 2019-03-08 NOTE — Progress Notes (Signed)
Pharmacy Antibiotic Note  Russell Patrick is a 86 y.o. male admitted on 03-13-19 with sepsis s/t pneumonia meeting 3/4 SIRS w/ elevated PCT of 0.34 in the setting of normal renal fx.  Pharmacy has been consulted for cefepime dosing. Patient received cefepime 2g, vanc 2g load, and flagyl 500 mg IV in the ED.  Plan: Will continue patient on cefepime 2g IV q12h per CrCl 30 - 60 ml/min and will continue to monitor and adjust doses as necessary.  Height: 6' (182.9 cm) Weight: 180 lb 12.4 oz (82 kg) IBW/kg (Calculated) : 77.6  Temp (24hrs), Avg:99.2 F (37.3 C), Min:99.2 F (37.3 C), Max:99.2 F (37.3 C)  Recent Labs  Lab 03/13/19 0148  WBC 15.5*  CREATININE 1.24  LATICACIDVEN 1.0    Estimated Creatinine Clearance: 48.7 mL/min (by C-G formula based on SCr of 1.24 mg/dL).    Allergies  Allergen Reactions  . Statins     Thank you for allowing pharmacy to be a part of this patient's care.  Thomasene Ripple, PharmD, BCPS Clinical Pharmacist March 13, 2019 5:13 AM

## 2019-03-08 NOTE — Progress Notes (Signed)
*  PRELIMINARY RESULTS* Echocardiogram 2D Echocardiogram has been performed.  Russell Patrick Apr 06, 2019, 2:18 PM

## 2019-03-08 NOTE — ED Notes (Signed)
Attempted to call floor for pt's room assignment. RN to call back

## 2019-03-08 NOTE — ED Notes (Addendum)
Swabbed pt for MRSA test. Pt became a little arousable. Pt started moving his arms and coughing. Pt opened his eyes. Talked to pt and informed him what was going on and where he was.  Propofol adjusted and rate increased.  CBG checked and is 258. Will give missed does of insulin at this time according to scale.Pt now resting.

## 2019-03-08 NOTE — Consult Note (Signed)
PHARMACY CONSULT NOTE - FOLLOW UP  Pharmacy Consult for Electrolyte Monitoring and Replacement   Russell Patrick is an 86 y.o. male who was admitted on 05-Apr-2019 with respiratory distress, hypoxic, and fever. EMS reports according to the facility, pt is COVID positive (01/31). However, SARS Coronavirus 2 by PCR was negative. PMH includes stroke with right-sided deficits, HTN, diabetes, PAD. Pt is intubated and on propofol IV. Pharmacy is consulted to assist with monitoring and replacing electrolytes.   Recent Labs: Potassium (mmol/L)  Date Value  April 05, 2019 3.6  09/14/2013 4.6   Calcium (mg/dL)  Date Value  58/52/7782 7.6 (L)   Calcium, Total (mg/dL)  Date Value  42/35/3614 9.0   Albumin (g/dL)  Date Value  43/15/4008 2.2 (L)   Sodium (mmol/L)  Date Value  April 05, 2019 143  09/14/2013 140     Assessment: 1. Electrolytes: Electrolytes are WNL. Corrected Calcium is 9 (WNL). Patient received LR boluses x 2 doses today. Continue to monitor electrolytes in am labs. Will replace to achieve goal for potassium ~4, magnesium ~2, and sodium ~140.   2. Glucose: A1c: 8.1. Pt has a hx of DM. Glucose-capillary monitored Q2H. Today's range 173-258. Pt has been started on SSI insulin aspart 0-15 units [place] Q4H today (11 units received so far today). Pt also received 1 dose of dexamethasone 10mg  IV today. In addition, pt was started on methylprednisolone 40mg  Q12H today (duration so far: 1 day).   3. Constipation: Last BM: PTA. Will continue to monitor and add constipation-relief meds as necessary.     ,PharmD Candidate 04/05/19 1:56 PM

## 2019-03-08 NOTE — ED Notes (Signed)
Pt satting at 100% on NRB

## 2019-03-08 NOTE — ED Notes (Addendum)
Pt given 120 rocc by sarah rn

## 2019-03-08 NOTE — ED Notes (Signed)
Date and time results received: 03/13/2019 0907 (use smartphrase ".now" to insert current time)  Test: troponin Critical Value: 3219  Name of Provider Notified: Jayme Cloud  Orders Received? Or Actions Taken?: Orders Received - See Orders for details

## 2019-03-08 NOTE — ED Notes (Addendum)
Pt had saturated brief. Pt cleaned up and clean brief applied. Pt pulled up in bed and now resting comfortably.

## 2019-03-08 NOTE — ED Triage Notes (Addendum)
Pt here via [facility] from [facility]. Pt found to have sats in the low 80's, pt tested covid positive today.  Pt satting with EMS at 90% on 15L NRB, HR ST 130, axillary temp 103, RR 40, unable to read a blood pressure. Pt with wet lung sounds on arrival.  Pt received NS on route.   Pt's baseline unknown, known right sided stroke.

## 2019-03-09 ENCOUNTER — Inpatient Hospital Stay: Payer: Medicare Other

## 2019-03-09 DIAGNOSIS — Z515 Encounter for palliative care: Secondary | ICD-10-CM

## 2019-03-09 DIAGNOSIS — T17918D Gastric contents in respiratory tract, part unspecified causing other injury, subsequent encounter: Secondary | ICD-10-CM

## 2019-03-09 DIAGNOSIS — Z7189 Other specified counseling: Secondary | ICD-10-CM

## 2019-03-09 DIAGNOSIS — J189 Pneumonia, unspecified organism: Secondary | ICD-10-CM

## 2019-03-09 LAB — RENAL FUNCTION PANEL
Albumin: 2.2 g/dL — ABNORMAL LOW (ref 3.5–5.0)
Anion gap: 12 (ref 5–15)
BUN: 34 mg/dL — ABNORMAL HIGH (ref 8–23)
CO2: 22 mmol/L (ref 22–32)
Calcium: 8.5 mg/dL — ABNORMAL LOW (ref 8.9–10.3)
Chloride: 109 mmol/L (ref 98–111)
Creatinine, Ser: 1.34 mg/dL — ABNORMAL HIGH (ref 0.61–1.24)
GFR calc Af Amer: 56 mL/min — ABNORMAL LOW (ref >60)
GFR calc non Af Amer: 48 mL/min — ABNORMAL LOW (ref >60)
Glucose, Bld: 203 mg/dL — ABNORMAL HIGH (ref 70–99)
Phosphorus: 3.8 mg/dL (ref 2.5–4.6)
Potassium: 3.7 mmol/L (ref 3.5–5.1)
Sodium: 143 mmol/L (ref 135–145)

## 2019-03-09 LAB — PROTIME-INR
INR: 2 — ABNORMAL HIGH (ref 0.8–1.2)
Prothrombin Time: 22.5 seconds — ABNORMAL HIGH (ref 11.4–15.2)

## 2019-03-09 LAB — CBC
HCT: 35.1 % — ABNORMAL LOW (ref 39.0–52.0)
Hemoglobin: 11.1 g/dL — ABNORMAL LOW (ref 13.0–17.0)
MCH: 28.8 pg (ref 26.0–34.0)
MCHC: 31.6 g/dL (ref 30.0–36.0)
MCV: 91.2 fL (ref 80.0–100.0)
Platelets: 335 10*3/uL (ref 150–400)
RBC: 3.85 MIL/uL — ABNORMAL LOW (ref 4.22–5.81)
RDW: 13.9 % (ref 11.5–15.5)
WBC: 26.3 10*3/uL — ABNORMAL HIGH (ref 4.0–10.5)
nRBC: 0 % (ref 0.0–0.2)

## 2019-03-09 LAB — MAGNESIUM: Magnesium: 2.1 mg/dL (ref 1.7–2.4)

## 2019-03-09 LAB — GLUCOSE, CAPILLARY
Glucose-Capillary: 118 mg/dL — ABNORMAL HIGH (ref 70–99)
Glucose-Capillary: 121 mg/dL — ABNORMAL HIGH (ref 70–99)
Glucose-Capillary: 126 mg/dL — ABNORMAL HIGH (ref 70–99)
Glucose-Capillary: 145 mg/dL — ABNORMAL HIGH (ref 70–99)
Glucose-Capillary: 152 mg/dL — ABNORMAL HIGH (ref 70–99)
Glucose-Capillary: 172 mg/dL — ABNORMAL HIGH (ref 70–99)
Glucose-Capillary: 211 mg/dL — ABNORMAL HIGH (ref 70–99)

## 2019-03-09 MED ORDER — FENTANYL BOLUS VIA INFUSION
12.5000 ug | INTRAVENOUS | Status: DC | PRN
  Administered 2019-03-09: 12.5 ug via INTRAVENOUS
  Filled 2019-03-09: qty 13

## 2019-03-09 MED ORDER — IPRATROPIUM BROMIDE 0.02 % IN SOLN
0.5000 mg | Freq: Four times a day (QID) | RESPIRATORY_TRACT | Status: DC
  Administered 2019-03-09 – 2019-03-11 (×8): 0.5 mg via RESPIRATORY_TRACT
  Filled 2019-03-09 (×8): qty 2.5

## 2019-03-09 MED ORDER — GLYCOPYRROLATE 0.2 MG/ML IJ SOLN
0.4000 mg | Freq: Four times a day (QID) | INTRAMUSCULAR | Status: DC
  Administered 2019-03-09 – 2019-03-10 (×4): 0.4 mg via INTRAVENOUS
  Filled 2019-03-09 (×4): qty 2

## 2019-03-09 MED ORDER — ORAL CARE MOUTH RINSE: 15.0000 mL | OROMUCOSAL | Status: DC

## 2019-03-09 MED ORDER — DEXMEDETOMIDINE HCL IN NACL 400 MCG/100ML IV SOLN
0.4000 ug/kg/h | INTRAVENOUS | Status: DC
  Administered 2019-03-09: 0.4 ug/kg/h via INTRAVENOUS
  Filled 2019-03-09: qty 100

## 2019-03-09 MED ORDER — INSULIN ASPART 100 UNIT/ML ~~LOC~~ SOLN
0.0000 [IU] | SUBCUTANEOUS | Status: DC
  Administered 2019-03-09 – 2019-03-11 (×6): 2 [IU] via SUBCUTANEOUS
  Filled 2019-03-09 (×8): qty 1

## 2019-03-09 MED ORDER — CHLORHEXIDINE GLUCONATE 0.12% ORAL RINSE (MEDLINE KIT)
15.0000 mL | Freq: Two times a day (BID) | OROMUCOSAL | Status: DC
  Administered 2019-03-09 – 2019-03-16 (×15): 15 mL via OROMUCOSAL
  Filled 2019-03-09 (×2): qty 15

## 2019-03-09 MED ORDER — FENTANYL 2500MCG IN NS 250ML (10MCG/ML) PREMIX INFUSION
0.0000 ug/h | INTRAVENOUS | Status: DC
  Administered 2019-03-09: 6.25 ug/h via INTRAVENOUS
  Administered 2019-03-10: 100 ug/h via INTRAVENOUS
  Filled 2019-03-09 (×2): qty 250

## 2019-03-09 NOTE — Consult Note (Signed)
Consultation Note Date: 03/09/2019   Patient Name: Russell Patrick  DOB: 1932/11/27  MRN: [id number]  Age / Sex: 86 y.o., male  PCP: Marisa Hua, MD Referring Physician: Tyler Pita, MD  Reason for Consultation: Establishing goals of care  HPI/Patient Profile: 86 y.o. male  with past medical history of recent left ischemic stroke with right sided deficits, PE/DVT requiring IVC filter, PAD, insomnia, HTN, recurrent UTI's, type 2 diabetes, sick sinus syndrome with pacemaker admitted on 03/13/2019 from [facility] with shortness of breath, hypoxia, fever with pneumonia and requiring intubation. Overall failure to thrive with significant decline since recent stroke.   Clinical Assessment and Goals of Care: I met today at Russell Patrick bedside with his son and daughter. Discussed care with Dr. Patsey Berthold and RN this morning. Son and daughter have recently spoken with Dr. Patsey Berthold for update. They understand that his prognosis is very poor at this stage and he has much declined since his past stroke. They understand that we are concerned with his swallow function and ability to protect his airway. They are trusting the medical team to let them know the right time to remove ventilator and are most concerned that he is comfortable and does not suffer. I told them that we do not expect that he will do well with extubation even though it is hard to know what this would look like as far as if he will live hours or days but we will ensure his comfort. I also explained that the most important thing to do is to discuss as a family to ensure everyone who needs to is able to be here or say goodbye (visitor policy allows 4 visitors). They will discuss with their mother and siblings who will need to be here. They are trying to decide if they want to be at bedside during this process or remember their father the way he was and not at end  of life. I will reach out to them tomorrow to plan one way extubation.   All questions/concerns addressed. Emotional support provided.   Primary Decision Maker NEXT OF KIN wife is elderly and relies on her children for decisions    SUMMARY OF RECOMMENDATIONS   - One way extubation most likely for tomorrow - If further decline prior to extubation family would like to be notified but do not desire escalation of care  Code Status/Advance Care Planning:  DNR   Symptom Management:   Secretions copious: Scheduled Robinul in preparation for one way extubation.   Agitation: limited d/t to low BP. RN reports that he does well with fentanyl but it does not last him long. Will order very low dose fentanyl infusion and monitor BP but comfort if ultimate goal here. Lower BP would be tolerated given plan of care moving towards comfort care.   Palliative Prophylaxis:   Aspiration, Delirium Protocol, Oral Care and Turn Reposition  Psycho-social/Spiritual:   Desire for further Chaplaincy support:yes  Additional Recommendations: Grief/Bereavement Support  Prognosis:   Hours - Days after extubation  Discharge  Planning: To Be Determined      Primary Diagnoses: Present on Admission: . Acute respiratory failure ([place])   I have reviewed the medical record, interviewed the patient and family, and examined the patient. The following aspects are pertinent.  History reviewed. No pertinent past medical history. Social History   Socioeconomic History  . Marital status: Married    Spouse name: Not on file  . Number of children: Not on file  . Years of education: Not on file  . Highest education level: Not on file  Occupational History  . Not on file  Tobacco Use  . Smoking status: Unknown If Ever Smoked  Substance and Sexual Activity  . Alcohol use: Not on file  . Drug use: Not on file  . Sexual activity: Not on file  Other Topics Concern  . Not on file  Social History Narrative    . Not on file   Social Determinants of Health   Financial Resource Strain:   . Difficulty of Paying Living Expenses: Not on file  Food Insecurity:   . Worried About Charity fundraiser in the Last Year: Not on file  . Ran Out of Food in the Last Year: Not on file  Transportation Needs:   . Lack of Transportation (Medical): Not on file  . Lack of Transportation (Non-Medical): Not on file  Physical Activity:   . Days of Exercise per Week: Not on file  . Minutes of Exercise per Session: Not on file  Stress:   . Feeling of Stress : Not on file  Social Connections:   . Frequency of Communication with Friends and Family: Not on file  . Frequency of Social Gatherings with Friends and Family: Not on file  . Attends Religious Services: Not on file  . Active Member of Clubs or Organizations: Not on file  . Attends Archivist Meetings: Not on file  . Marital Status: Not on file   History reviewed. No pertinent family history. Scheduled Meds: . aspirin  81 mg Per Tube Daily  . atorvastatin  80 mg Per Tube QPM  . chlorhexidine gluconate (MEDLINE KIT)  15 mL Mouth Rinse BID  . Chlorhexidine Gluconate Cloth  6 each Topical Daily  . insulin aspart  0-15 Units Subcutaneous Q4H  . mouth rinse  15 mL Mouth Rinse 10 times per day  . methylPREDNISolone (SOLU-MEDROL) injection  40 mg Intravenous Q12H   Continuous Infusions: . ceFEPime (MAXIPIME) IV 2 g (03/09/19 0647)  . dexmedetomidine (PRECEDEX) IV infusion 0.4 mcg/kg/hr (03/09/19 0944)  . famotidine (PEPCID) IV 20 mg (03/09/19 0903)  . lactated ringers 125 mL/hr at 03/09/19 0600  . propofol (DIPRIVAN) infusion 5 mcg/kg/min (03/09/19 0600)   PRN Meds:.fentaNYL (SUBLIMAZE) injection, ipratropium-albuterol, ondansetron (ZOFRAN) IV Allergies  Allergen Reactions  . Statins    Review of Systems  Unable to perform ROS: Intubated    Physical Exam Vitals and nursing note reviewed.  Constitutional:      General: He is sleeping.  He is not in acute distress.    Appearance: He is ill-appearing.     Interventions: He is intubated.     Comments: Frail, elderly  Cardiovascular:     Rate and Rhythm: Tachycardia present.  Pulmonary:     Effort: No tachypnea, accessory muscle usage or respiratory distress. He is intubated.  Abdominal:     General: Abdomen is flat.     Palpations: Abdomen is soft.  Neurological:     Comments: Sedated on vent  Vital Signs: BP (!) 143/80   Pulse (!) 110   Temp 97.6 F (36.4 C) (Oral)   Resp (!) 27   Ht 6' 0.01" (1.829 m)   Wt 78.3 kg   SpO2 100%   BMI 23.41 kg/m  Pain Scale: CPOT POSS *See Group Information*: S-Acceptable,Sleep, easy to arouse     SpO2: SpO2: 100 % O2 Device:SpO2: 100 % O2 Flow Rate: .   IO: Intake/output summary:   Intake/Output Summary (Last 24 hours) at 03/09/2019 1143 Last data filed at 03/09/2019 0600 Gross per 24 hour  Intake 956.5 ml  Output 505 ml  Net 451.5 ml    LBM: Last BM Date: 03/09/19 Baseline Weight: Weight: 82 kg Most recent weight: Weight: 78.3 kg     Palliative Assessment/Data:     Time In: 1240 Time Out: 1330 Time Total: 50 min Greater than 50%  of this time was spent counseling and coordinating care related to the above assessment and plan.  Signed by: Vinie Sill, NP Palliative Medicine Team Pager # [phone] (M-F 8a-5p) Team Phone # [phone] (Nights/Weekends)

## 2019-03-09 NOTE — Consult Note (Signed)
PHARMACY CONSULT NOTE - FOLLOW UP  Pharmacy Consult for Electrolyte Monitoring and Replacement   Russell Patrick is an 86 y.o. male who was admitted on March 16, 2019 with respiratory distress, hypoxic, and fever. EMS reports according to the facility, pt is COVID positive (01/31). However, SARS Coronavirus 2 by PCR was negative. PMH includes stroke with right-sided deficits, HTN, diabetes, PAD. Pt is intubated and on propofol/precedex IV. Per am rounds, plans were discussed on transitioning patient to comfort care. Pharmacy is consulted to assist with monitoring and replacing electrolytes.   Recent Labs: Potassium (mmol/L)  Date Value  03/09/2019 3.7  09/14/2013 4.6   Magnesium (mg/dL)  Date Value  05/39/7673 2.1   Calcium (mg/dL)  Date Value  41/93/7902 8.5 (L)   Calcium, Total (mg/dL)  Date Value  40/97/3532 9.0   Albumin (g/dL)  Date Value  99/24/2683 2.2 (L)   Phosphorus (mg/dL)  Date Value  41/96/2229 3.8   Sodium (mmol/L)  Date Value  03/09/2019 143  09/14/2013 140     Assessment: 1. Electrolytes: Electrolytes are WNL. Corrected Calcium is 9.9 (WNL). Patient is on LR IV infusion. Continue to monitor electrolytes in am labs. Will replace to achieve goal for potassium ~4, magnesium ~2, and sodium ~140.   2. Glucose: A1c: 8.1. Pt has a hx of DM. Glucose-capillary monitored Q4H. Today's range 145-211. Pt is on SSI insulin aspart 0-15 units [place] Q4H (11 units received so far today). In addition, pt was started on methylprednisolone 40mg  Q12H (duration so far: 2 days).   3. Constipation: Last BM: 02/02. Will continue to monitor and add constipation-relief meds as necessary.     04/02 ,PharmD Candidate 03/09/2019 11:55 AM

## 2019-03-09 NOTE — Progress Notes (Signed)
Follow up - Critical Care Medicine Note  Patient Details:    Russell Patrick is an 86 y.o. male with multiple prior CVAs and resultant dysphagia, admitted with acute hypoxic respiratory failure due to aspiration pneumonia and acute encephalopathy due to severe hypoxia requiring intubation and mechanical ventilation.   Lines, Airways, Drains: Airway 7.5 mm (Active)  Secured at (cm) 25 cm 03/09/19 1110  Measured From Lips 03/09/19 1200  Secured Location Left 03/09/19 1200  Secured By [employer] 03/09/19 1200  Tube Holder Repositioned Yes 03/09/19 1110  Cuff Pressure (cm H2O) 26 cm H2O 03/09/19 0758  Site Condition Dry 03/09/19 1110     NG/OG Tube Orogastric 18 Fr. Center mouth Xray;Aucultation Documented cm marking at nare/ corner of mouth 55 cm (Active)  Cm Marking at Nare/Corner of Mouth (if applicable) 55 cm 58/09/98 0736  Site Assessment Clean;Dry;Intact 03/09/19 1200  Ongoing Placement Verification No acute changes, not attributed to clinical condition;No change in cm markings or external length of tube from initial placement;No change in respiratory status 03/09/19 1200  Status Clamped 03/09/19 1200  Intake (mL) 30 mL 03/09/19 0230  Output (mL) 0 mL 03/09/19 0105     Urethral Catheter Imma RN Straight-tip 16 Fr. (Active)  Indication for Insertion or Continuance of Catheter Unstable critically ill patients first 24-48 hours (See Criteria) 03/09/19 1047  Site Assessment Clean;Intact 03/09/19 1047  Catheter Maintenance Bag below level of bladder 03/09/19 1047  Collection Container Standard drainage bag 03/09/19 1047  Securement Method Leg strap 03/09/19 1047  Urinary Catheter Interventions (if applicable) Unclamped 33/82/50 1047  Output (mL) 75 mL 03/09/19 0500    Anti-infectives:  Anti-infectives (From admission, onward)   Start     Dose/Rate Route Frequency Ordered Stop   03-14-2019 1600  ceFEPIme (MAXIPIME) 2 g in sodium chloride 0.9 % 100 mL IVPB     2 g 200 mL/hr  over 30 Minutes Intravenous Every 12 hours 2019-03-14 0512     03/14/19 0330  vancomycin (VANCOCIN) IVPB 1000 mg/200 mL premix     1,000 mg 200 mL/hr over 60 Minutes Intravenous  Once 03-14-2019 0325 2019/03/14 0644   Mar 14, 2019 0215  ceFEPIme (MAXIPIME) 2 g in sodium chloride 0.9 % 100 mL IVPB     2 g 200 mL/hr over 30 Minutes Intravenous  Once 03-14-2019 0211 Mar 14, 2019 0500   03-14-2019 0215  metroNIDAZOLE (FLAGYL) IVPB 500 mg     500 mg 100 mL/hr over 60 Minutes Intravenous  Once 14-Mar-2019 0211 03/14/2019 0339   03/14/19 0215  vancomycin (VANCOCIN) IVPB 1000 mg/200 mL premix     1,000 mg 200 mL/hr over 60 Minutes Intravenous  Once 14-Mar-2019 0211 2019-03-14 5397      Microbiology: Results for orders placed or performed during the hospital encounter of 03-14-2019  Blood Culture (routine x 2)     Status: None (Preliminary result)   Collection Time: 14-Mar-2019  1:48 AM   Specimen: BLOOD LEFT HAND  Result Value Ref Range Status   Specimen Description BLOOD LEFT HAND  Final   Special Requests   Final    Blood Culture results may not be optimal due to an inadequate volume of blood received in culture bottles   Culture   Final    NO GROWTH 1 DAY Performed at [facility], [address] [place],  [address]    Report Status PENDING  Incomplete  Blood Culture (routine x 2)     Status: None (Preliminary result)   Collection Time: 03/14/19  1:53 AM   Specimen: BLOOD LEFT HAND  Result Value Ref Range Status   Specimen Description BLOOD LEFT HAND  Final   Special Requests Blood Culture adequate volume  Final   Culture   Final    NO GROWTH 1 DAY Performed at [facility], [address] [place], [place] [address]    Report Status PENDING  Incomplete  Respiratory Panel by RT PCR (Flu A&B, Covid) - Nasopharyngeal Swab     Status: None   Collection Time: 03/21/2019  2:28 AM   Specimen: Nasopharyngeal Swab  Result Value Ref Range Status   SARS Coronavirus 2 by RT PCR NEGATIVE  NEGATIVE Final    Comment: (NOTE) SARS-CoV-2 target nucleic acids are NOT DETECTED. The SARS-CoV-2 RNA is generally detectable in upper respiratoy specimens during the acute phase of infection. The lowest concentration of SARS-CoV-2 viral copies this assay can detect is 131 copies/mL. A negative result does not preclude SARS-Cov-2 infection and should not be used as the sole basis for treatment or other patient management decisions. A negative result may occur with  improper specimen collection/handling, submission of specimen other than nasopharyngeal swab, presence of viral mutation(s) within the areas targeted by this assay, and inadequate number of viral copies (<131 copies/mL). A negative result must be combined with clinical observations, patient history, and epidemiological information. The expected result is Negative. Fact Sheet for Patients:  https://www.moore.com/ Fact Sheet for Healthcare Providers:  https://www.young.biz/ This test is not yet ap proved or cleared by the [place] FDA and  has been authorized for detection and/or diagnosis of SARS-CoV-2 by FDA under an Emergency Use Authorization (EUA). This EUA will remain  in effect (meaning this test can be used) for the duration of the COVID-19 declaration under Section 564(b)(1) of the Act, 21 U.S.C. section 360bbb-3(b)(1), unless the authorization is terminated or revoked sooner.    Influenza A by PCR NEGATIVE NEGATIVE Final   Influenza B by PCR NEGATIVE NEGATIVE Final    Comment: (NOTE) The Xpert Xpress SARS-CoV-2/FLU/RSV assay is intended as an aid in  the diagnosis of influenza from Nasopharyngeal swab specimens and  should not be used as a sole basis for treatment. Nasal washings and  aspirates are unacceptable for Xpert Xpress SARS-CoV-2/FLU/RSV  testing. Fact Sheet for Patients: https://www.moore.com/ Fact Sheet for Healthcare  Providers: https://www.young.biz/ This test is not yet approved or cleared by the [place] FDA and  has been authorized for detection and/or diagnosis of SARS-CoV-2 by  FDA under an Emergency Use Authorization (EUA). This EUA will remain  in effect (meaning this test can be used) for the duration of the  Covid-19 declaration under Section 564(b)(1) of the Act, 21  U.S.C. section 360bbb-3(b)(1), unless the authorization is  terminated or revoked. Performed at [facility], [address] [place], [place] [address]   MRSA PCR Screening     Status: None   Collection Time: 03/11/2019  8:20 AM   Specimen: Nasopharyngeal  Result Value Ref Range Status   MRSA by PCR NEGATIVE NEGATIVE Final    Comment:        The GeneXpert MRSA Assay (FDA approved for NASAL specimens only), is one component of a comprehensive MRSA colonization surveillance program. It is not intended to diagnose MRSA infection nor to guide or monitor treatment for MRSA infections. Performed at [facility], [address] [place], [place] [address]     Best Practice/Protocols:  VTE Prophylaxis: Mechanical GI Prophylaxis: Antihistamine Intermittent Sedation  Events:  2/01 admitted due to acute hypoxic respiratory failure, intubated mechanically ventilated 2/02 remains on vent.  Copious secretions glycopyrrolate initiated   Studies: CT HEAD WO CONTRAST  Result Date: 14-Mar-2019 CLINICAL DATA:  Altered mental status. EXAM: CT HEAD WITHOUT CONTRAST TECHNIQUE: Contiguous axial images were obtained from the base of the skull through the vertex without intravenous contrast. COMPARISON:  MRI 08/30/2010 FINDINGS: Brain: Age related cerebral atrophy, ventriculomegaly and periventricular white matter disease. Remote infarcts are noted, most notably in a watershed area in the left frontoparietal region. White matter changes versus small lacunar type brainstem infarcts of uncertain age.  No mass lesions or extra-axial fluid collections. Vascular: Vascular calcifications but no aneurysm or hyperdense vessels. Skull: No skull fracture or bone lesions. Sinuses/Orbits: Scattered ethmoid and maxillary sinus disease. The mastoid air cells and middle ear cavities are clear. Other: No scalp lesions or hematoma. IMPRESSION: 1. Age related cerebral atrophy, ventriculomegaly and periventricular white matter disease. 2. Remote infarcts, most notably in the left frontoparietal region. 3. White matter changes versus small lacunar type brainstem infarcts of uncertain age. 4. No mass lesions or extra-axial fluid collections. Electronically Signed   By: Rudie Meyer M.D.   On: March 14, 2019 06:05   DG Chest Port 1 View  Result Date: 03/09/2019 CLINICAL DATA:  Respiratory failure. EXAM: PORTABLE CHEST 1 VIEW COMPARISON:  03-14-2019 1.  04/08/2008. FINDINGS: Endotracheal tube and NG tube in stable position. Cardiac pacer with lead tips over the right atrium and right ventricle. Heart size normal. Interstitial prominence is again noted bilaterally. Interstitial process such as pneumonitis could present this fashion. Slight improved aeration in the left lung base noted on today's exam. No prominent pleural effusion. No pneumothorax. IMPRESSION: 1.  Endotracheal tube and NG tube in stable position. 2.  Cardiac pacer stable position.  Heart size normal. 3. Diffuse bilateral interstitial infiltrates again noted. Slight improvement in aeration in the left lung base noted on today's exam. Electronically Signed   By: Maisie Fus  Register   On: 03/09/2019 11:28   DG Chest Port 1 View  Result Date: 03-14-19 CLINICAL DATA:  Check endotracheal tube placement EXAM: PORTABLE CHEST 1 VIEW COMPARISON:  09/14/2013 FINDINGS: Cardiac shadow is within normal limits. Endotracheal tube is seen in satisfactory position. Gastric catheter is noted with the tip in the stomach although the proximal side port lies at the GE junction. This  could be advanced several cm further into the stomach. Lungs demonstrate patchy bibasilar infiltrates left greater than right. No bony abnormality is seen. IMPRESSION: Bibasilar infiltrates. Tubes and lines as described. Electronically Signed   By: Alcide Clever M.D.   On: 2019-03-14 02:18    Consults: Palliative care  Subjective:    Overnight Issues: Able to interact with family last night, agitated this morning.  Not tolerating sedatives due to hypotension.  Objective:  Vital signs for last 24 hours: Temp:  [95.6 F (35.3 C)-98.9 F (37.2 C)] 98 F (36.7 C) (02/02 1349) Pulse Rate:  [60-110] 60 (02/02 1300) Resp:  [15-27] 22 (02/02 1000) BP: (72-143)/(46-80) 92/57 (02/02 1349) SpO2:  [93 %-100 %] 99 % (02/02 1300) FiO2 (%):  [30 %-40 %] 30 % (02/02 1405)  Hemodynamic parameters for last 24 hours:    Intake/Output from previous day: 02/01 0701 - 02/02 0700 In: 956.5 [I.V.:826.5; NG/GT:30; IV Piggyback:100] Out: 505 [Urine:475; Emesis/NG output:30]  Intake/Output this shift: No intake/output data recorded.  Vent settings for last 24 hours: Vent Mode: PRVC FiO2 (%):  [30 %-40 %] 30 % Set  Rate:  [14 bmp] 14 bmp Vt Set:  [500 mL] 500 mL PEEP:  [5 cmH20] 5 cmH20 Pressure Support:  [5 cmH20] 5 cmH20  Physical Exam:  General:  Chronically ill appearing male, intubated, was able to interact with the patient, can track, cannot follow commands consistently. Neuro:  Somewhat agitated this morning, not following commands consistently, right hemiparesis (dense)  HEENT: supple, no JVD, trachea midline no crepitus  Cardiovascular: sinus tach, BBB, no R/G Lungs: rhonchi throughout, even, non labored, copious secretions through ET tube  Abdomen: +BS x4, obese, soft, no grimacing on palpation, non distended, right inguinal hernia, reducible Musculoskeletal:  Decreased muscle tone on the right, 2+ anasarca Skin: Skin wounds as noted previously on right foot.  No rashes or  petechiae  Assessment/Plan:   Acute hypoxic respiratory failure secondary to aspiration pneumonia  On mechanical ventilation Full vent support for now-vent settings reviewed with RT SBT failed today: Copious secretions, agitation, tachycardia VAP bundle implemented, continue IV steroids wean as tolerated  Scheduled ipratropium to control secretions Glycopyrrolate for control of secretions  Essential HTN  Hx: DVT/PE requiring IVC filter, CVA with Right Sided Deficits, Pacemaker, and Recurrent Syncopal Episodes  Continuous telemetry monitoring  Echo interpretation pending  Continue outpatient  aspirin, and atorvastatin  Hold outpatient xarelto, metoprolol, and lisinopril for now   Leukocytosis secondary to pneumonia  Trend WBC and monitor fever curve Trend PCT  Follow cultures  Continue cefepime   Urinary retention  Foley catheter placed by night shift nurse  Type II Diabetes Mellitus  CBG's q4hrs  SSI   Acute encephalopathy likely secondary to hypoxia  Mechanical ventilation pain/discomfort Maintain RASS goal 0 to -1  Precedex gtt and prn fentanyl to maintain RASS goal  Precedex currently on hold due to hypotension WUA daily  CT Head shows extensive encephalomalacia from prior CVAs possible new infarcts  Best Practice: VTE px: SCD's, will hold chemical prophylaxis for now, has IVC filter  SUP px: iv famotidine Diet: On tube feeds  Goals of care discussion: I discussed at length with the patient's son and daughter who are present today.  They corroborate patient's DNR status.  Have obtain palliative consult.  Plan will be for transitioning to comfort care in the morning.  The patient's son and daughter corroborate that he has been bedridden for 5 years due to recurrent strokes.  Most recent stroke was in the skilled nursing facility that he had been admitted to after another stroke on 4 January treated at [facility].  They are appreciative of the care we have offered the  patient.  They understand his prognosis overall is exceedingly poor.    LOS: 1 day   Additional comments: Discussed during multidisciplinary rounds.  Discussed with Palliative Care.  Critical Care Total Time*: 45 Minutes  C. Danice Goltz, MD [facility] 03/09/2019  *Care during the described time interval was provided by me and/or other providers on the critical care team.  I have reviewed this patient's available data, including medical history, events of note, physical examination and test results as part of my evaluation.

## 2019-03-10 LAB — CBC WITH DIFFERENTIAL/PLATELET
Abs Immature Granulocytes: 0.34 10*3/uL — ABNORMAL HIGH (ref 0.00–0.07)
Basophils Absolute: 0.1 10*3/uL (ref 0.0–0.1)
Basophils Relative: 0 %
Eosinophils Absolute: 0 10*3/uL (ref 0.0–0.5)
Eosinophils Relative: 0 %
HCT: 36 % — ABNORMAL LOW (ref 39.0–52.0)
Hemoglobin: 11.3 g/dL — ABNORMAL LOW (ref 13.0–17.0)
Immature Granulocytes: 1 %
Lymphocytes Relative: 6 %
Lymphs Abs: 1.7 10*3/uL (ref 0.7–4.0)
MCH: 29.1 pg (ref 26.0–34.0)
MCHC: 31.4 g/dL (ref 30.0–36.0)
MCV: 92.8 fL (ref 80.0–100.0)
Monocytes Absolute: 1.3 10*3/uL — ABNORMAL HIGH (ref 0.1–1.0)
Monocytes Relative: 5 %
Neutro Abs: 23.4 10*3/uL — ABNORMAL HIGH (ref 1.7–7.7)
Neutrophils Relative %: 88 %
Platelets: 352 10*3/uL (ref 150–400)
RBC: 3.88 MIL/uL — ABNORMAL LOW (ref 4.22–5.81)
RDW: 14 % (ref 11.5–15.5)
Smear Review: NORMAL
WBC: 26.8 10*3/uL — ABNORMAL HIGH (ref 4.0–10.5)
nRBC: 0 % (ref 0.0–0.2)

## 2019-03-10 LAB — BASIC METABOLIC PANEL
Anion gap: 11 (ref 5–15)
BUN: 42 mg/dL — ABNORMAL HIGH (ref 8–23)
CO2: 23 mmol/L (ref 22–32)
Calcium: 8.5 mg/dL — ABNORMAL LOW (ref 8.9–10.3)
Chloride: 112 mmol/L — ABNORMAL HIGH (ref 98–111)
Creatinine, Ser: 1.33 mg/dL — ABNORMAL HIGH (ref 0.61–1.24)
GFR calc Af Amer: 56 mL/min — ABNORMAL LOW (ref >60)
GFR calc non Af Amer: 49 mL/min — ABNORMAL LOW (ref >60)
Glucose, Bld: 169 mg/dL — ABNORMAL HIGH (ref 70–99)
Potassium: 4.5 mmol/L (ref 3.5–5.1)
Sodium: 146 mmol/L — ABNORMAL HIGH (ref 135–145)

## 2019-03-10 LAB — GLUCOSE, CAPILLARY
Glucose-Capillary: 131 mg/dL — ABNORMAL HIGH (ref 70–99)
Glucose-Capillary: 148 mg/dL — ABNORMAL HIGH (ref 70–99)
Glucose-Capillary: 146 mg/dL — ABNORMAL HIGH (ref 70–99)
Glucose-Capillary: 88 mg/dL (ref 70–99)

## 2019-03-10 LAB — MAGNESIUM: Magnesium: 2.3 mg/dL (ref 1.7–2.4)

## 2019-03-10 LAB — PHOSPHORUS: Phosphorus: 4.4 mg/dL (ref 2.5–4.6)

## 2019-03-10 MED ORDER — FENTANYL BOLUS VIA INFUSION
12.5000 ug | INTRAVENOUS | Status: DC | PRN
  Filled 2019-03-10: qty 50

## 2019-03-10 MED ORDER — LORAZEPAM 2 MG/ML IJ SOLN
0.5000 mg | Freq: Four times a day (QID) | INTRAMUSCULAR | Status: DC | PRN
  Administered 2019-03-11: 0.5 mg via INTRAVENOUS
  Filled 2019-03-10: qty 1

## 2019-03-10 MED ORDER — GLYCOPYRROLATE 0.2 MG/ML IJ SOLN
0.4000 mg | INTRAMUSCULAR | Status: DC
  Administered 2019-03-10 – 2019-03-12 (×12): 0.4 mg via INTRAVENOUS
  Filled 2019-03-10 (×16): qty 2

## 2019-03-10 NOTE — Progress Notes (Signed)
Pharmacy Antibiotic Note  Russell Patrick is a 86 y.o. male admitted on 04/05/2019 with respiratory distress, hypoxia, and fever. Pt is being treated for pneumonia. Pt is intubated on mechanical ventilator and fentanyl IV. WBCs are elevated and a slight increase from yesterday. Pt is afebrile. Cultures have not shown microbial growth. No known drug allergies to cefepime. Pharmacy has been consulted for Cefepime dosing.  Plan: Continue cefepime 2g IV Q12H (dose was previously adjusted to accommodate renal function)  Will monitor renal function and adjust dose as necessary. Antibiotic day so far: 3 days out of 7   Height: 6' 0.01" (182.9 cm) Weight: 172 lb 9.9 oz (78.3 kg) IBW/kg (Calculated) : 77.62  Temp (24hrs), Avg:98.1 F (36.7 C), Min:97.8 F (36.6 C), Max:98.5 F (36.9 C)  Recent Labs  Lab 05-Apr-2019 0148 April 05, 2019 0520 03/09/19 0429 03/10/19 0501  WBC 15.5*  --  26.3* 26.8*  CREATININE 1.24  --  1.34* 1.33*  LATICACIDVEN 1.0 2.2*  --   --     Estimated Creatinine Clearance: 45.4 mL/min (A) (by C-G formula based on SCr of 1.33 mg/dL (H)).    Allergies  Allergen Reactions   Statins     Antimicrobials this admission: Vancomycin IV 02/01 >> 02/01 Metronidazole IV 02/01 >> 02/01 Cefepime IV 02/01 >> 02/07   Dose adjustments this admission: NA  Microbiology results: 02/01 BCx (x2): No growth x 2 days 02/01 MRSA PCR: Negative 02/01 SARS Coronavirus 2 by PCR: Negative 02/01 Influenza A/B: Negative   Thank you for allowing pharmacy to be a part of this patient's care.  Rosalin Hawking, PharmD Candidate 03/10/2019 1:13 PM

## 2019-03-10 NOTE — Progress Notes (Signed)
Follow up - Critical Care Medicine Note  Patient Details:    Russell Patrick is an 86 y.o. male with multiple prior CVAs and resultant dysphagia, admitted with acute hypoxic respiratory failure due to aspiration pneumonia and acute encephalopathy due to severe hypoxia requiring intubation and mechanical ventilation.   Lines, Airways, Drains: Airway 7.5 mm (Active)  Secured at (cm) 25 cm 03/09/19 1110  Measured From Lips 03/09/19 1200  Secured Location Left 03/09/19 1200  Secured By [employer] 03/09/19 1200  Tube Holder Repositioned Yes 03/09/19 1110  Cuff Pressure (cm H2O) 26 cm H2O 03/09/19 0758  Site Condition Dry 03/09/19 1110     NG/OG Tube Orogastric 18 Fr. Center mouth Xray;Aucultation Documented cm marking at nare/ corner of mouth 55 cm (Active)  Cm Marking at Nare/Corner of Mouth (if applicable) 55 cm 22/02/54 0736  Site Assessment Clean;Dry;Intact 03/09/19 1200  Ongoing Placement Verification No acute changes, not attributed to clinical condition;No change in cm markings or external length of tube from initial placement;No change in respiratory status 03/09/19 1200  Status Clamped 03/09/19 1200  Intake (mL) 30 mL 03/09/19 0230  Output (mL) 0 mL 03/09/19 0105     Urethral Catheter Imma RN Straight-tip 16 Fr. (Active)  Indication for Insertion or Continuance of Catheter Unstable critically ill patients first 24-48 hours (See Criteria) 03/09/19 1047  Site Assessment Clean;Intact 03/09/19 1047  Catheter Maintenance Bag below level of bladder 03/09/19 1047  Collection Container Standard drainage bag 03/09/19 1047  Securement Method Leg strap 03/09/19 1047  Urinary Catheter Interventions (if applicable) Unclamped 27/06/23 1047  Output (mL) 75 mL 03/09/19 0500    Anti-infectives:  Anti-infectives (From admission, onward)   Start     Dose/Rate Route Frequency Ordered Stop   April 02, 2019 1600  ceFEPIme (MAXIPIME) 2 g in sodium chloride 0.9 % 100 mL IVPB     2 g 200 mL/hr  over 30 Minutes Intravenous Every 12 hours Apr 02, 2019 0512     02-Apr-2019 0330  vancomycin (VANCOCIN) IVPB 1000 mg/200 mL premix     1,000 mg 200 mL/hr over 60 Minutes Intravenous  Once 02-Apr-2019 0325 April 02, 2019 0644   2019-04-02 0215  ceFEPIme (MAXIPIME) 2 g in sodium chloride 0.9 % 100 mL IVPB     2 g 200 mL/hr over 30 Minutes Intravenous  Once 04/02/2019 0211 04-02-2019 0500   04/02/2019 0215  metroNIDAZOLE (FLAGYL) IVPB 500 mg     500 mg 100 mL/hr over 60 Minutes Intravenous  Once 04/02/19 0211 04/02/2019 0339   04-02-19 0215  vancomycin (VANCOCIN) IVPB 1000 mg/200 mL premix     1,000 mg 200 mL/hr over 60 Minutes Intravenous  Once 2019/04/02 0211 04-02-19 7628      Microbiology: Results for orders placed or performed during the hospital encounter of 02-Apr-2019  Blood Culture (routine x 2)     Status: None (Preliminary result)   Collection Time: 04-02-2019  1:48 AM   Specimen: BLOOD LEFT HAND  Result Value Ref Range Status   Specimen Description BLOOD LEFT HAND  Final   Special Requests   Final    Blood Culture results may not be optimal due to an inadequate volume of blood received in culture bottles   Culture   Final    NO GROWTH 2 DAYS Performed at [facility], [address] [place], [place] [address]    Report Status PENDING  Incomplete  Blood Culture (routine x 2)     Status: None (Preliminary result)   Collection Time: 04-02-19  1:53 AM   Specimen: BLOOD LEFT HAND  Result Value Ref Range Status   Specimen Description BLOOD LEFT HAND  Final   Special Requests Blood Culture adequate volume  Final   Culture   Final    NO GROWTH 2 DAYS Performed at [facility], [address], [place], [place] [address]    Report Status PENDING  Incomplete  Respiratory Panel by RT PCR (Flu A&B, Covid) - Nasopharyngeal Swab     Status: None   Collection Time: 03/23/19  2:28 AM   Specimen: Nasopharyngeal Swab  Result Value Ref Range Status   SARS Coronavirus 2 by RT PCR NEGATIVE  NEGATIVE Final    Comment: (NOTE) SARS-CoV-2 target nucleic acids are NOT DETECTED. The SARS-CoV-2 RNA is generally detectable in upper respiratoy specimens during the acute phase of infection. The lowest concentration of SARS-CoV-2 viral copies this assay can detect is 131 copies/mL. A negative result does not preclude SARS-Cov-2 infection and should not be used as the sole basis for treatment or other patient management decisions. A negative result may occur with  improper specimen collection/handling, submission of specimen other than nasopharyngeal swab, presence of viral mutation(s) within the areas targeted by this assay, and inadequate number of viral copies (<131 copies/mL). A negative result must be combined with clinical observations, patient history, and epidemiological information. The expected result is Negative. Fact Sheet for Patients:  https://www.moore.com/ Fact Sheet for Healthcare Providers:  https://www.young.biz/ This test is not yet ap proved or cleared by the [place] FDA and  has been authorized for detection and/or diagnosis of SARS-CoV-2 by FDA under an Emergency Use Authorization (EUA). This EUA will remain  in effect (meaning this test can be used) for the duration of the COVID-19 declaration under Section 564(b)(1) of the Act, 21 U.S.C. section 360bbb-3(b)(1), unless the authorization is terminated or revoked sooner.    Influenza A by PCR NEGATIVE NEGATIVE Final   Influenza B by PCR NEGATIVE NEGATIVE Final    Comment: (NOTE) The Xpert Xpress SARS-CoV-2/FLU/RSV assay is intended as an aid in  the diagnosis of influenza from Nasopharyngeal swab specimens and  should not be used as a sole basis for treatment. Nasal washings and  aspirates are unacceptable for Xpert Xpress SARS-CoV-2/FLU/RSV  testing. Fact Sheet for Patients: https://www.moore.com/ Fact Sheet for Healthcare  Providers: https://www.young.biz/ This test is not yet approved or cleared by the [place] FDA and  has been authorized for detection and/or diagnosis of SARS-CoV-2 by  FDA under an Emergency Use Authorization (EUA). This EUA will remain  in effect (meaning this test can be used) for the duration of the  Covid-19 declaration under Section 564(b)(1) of the Act, 21  U.S.C. section 360bbb-3(b)(1), unless the authorization is  terminated or revoked. Performed at [facility], [address] [place], [place] [address]   MRSA PCR Screening     Status: None   Collection Time: Mar 23, 2019  8:20 AM   Specimen: Nasopharyngeal  Result Value Ref Range Status   MRSA by PCR NEGATIVE NEGATIVE Final    Comment:        The GeneXpert MRSA Assay (FDA approved for NASAL specimens only), is one component of a comprehensive MRSA colonization surveillance program. It is not intended to diagnose MRSA infection nor to guide or monitor treatment for MRSA infections. Performed at [facility], [address] [place], [place] [address]     Best Practice/Protocols:  VTE Prophylaxis: Mechanical GI Prophylaxis: Antihistamine Intermittent Sedation  Events:  2/01 admitted due to acute hypoxic respiratory failure, intubated mechanically ventilated 2/02 remains on vent.  Copious secretions glycopyrrolate initiated 2/03 remains on vent.  Secretions were manageable.  Family making end-of-life decisions.   Studies: CT HEAD WO CONTRAST  Result Date: 03/18/2019 CLINICAL DATA:  Altered mental status. EXAM: CT HEAD WITHOUT CONTRAST TECHNIQUE: Contiguous axial images were obtained from the base of the skull through the vertex without intravenous contrast. COMPARISON:  MRI 08/30/2010 FINDINGS: Brain: Age related cerebral atrophy, ventriculomegaly and periventricular white matter disease. Remote infarcts are noted, most notably in a watershed area in the left frontoparietal  region. White matter changes versus small lacunar type brainstem infarcts of uncertain age. No mass lesions or extra-axial fluid collections. Vascular: Vascular calcifications but no aneurysm or hyperdense vessels. Skull: No skull fracture or bone lesions. Sinuses/Orbits: Scattered ethmoid and maxillary sinus disease. The mastoid air cells and middle ear cavities are clear. Other: No scalp lesions or hematoma. IMPRESSION: 1. Age related cerebral atrophy, ventriculomegaly and periventricular white matter disease. 2. Remote infarcts, most notably in the left frontoparietal region. 3. White matter changes versus small lacunar type brainstem infarcts of uncertain age. 4. No mass lesions or extra-axial fluid collections. Electronically Signed   By: Rudie Meyer M.D.   On: 2019/03/18 06:05   DG Chest Port 1 View  Result Date: 03/09/2019 CLINICAL DATA:  Respiratory failure. EXAM: PORTABLE CHEST 1 VIEW COMPARISON:  03-18-2019 1.  04/08/2008. FINDINGS: Endotracheal tube and NG tube in stable position. Cardiac pacer with lead tips over the right atrium and right ventricle. Heart size normal. Interstitial prominence is again noted bilaterally. Interstitial process such as pneumonitis could present this fashion. Slight improved aeration in the left lung base noted on today's exam. No prominent pleural effusion. No pneumothorax. IMPRESSION: 1.  Endotracheal tube and NG tube in stable position. 2.  Cardiac pacer stable position.  Heart size normal. 3. Diffuse bilateral interstitial infiltrates again noted. Slight improvement in aeration in the left lung base noted on today's exam. Electronically Signed   By: Maisie Fus  Register   On: 03/09/2019 11:28   DG Chest Port 1 View  Result Date: 03/18/2019 CLINICAL DATA:  Check endotracheal tube placement EXAM: PORTABLE CHEST 1 VIEW COMPARISON:  09/14/2013 FINDINGS: Cardiac shadow is within normal limits. Endotracheal tube is seen in satisfactory position. Gastric catheter is noted  with the tip in the stomach although the proximal side port lies at the GE junction. This could be advanced several cm further into the stomach. Lungs demonstrate patchy bibasilar infiltrates left greater than right. No bony abnormality is seen. IMPRESSION: Bibasilar infiltrates. Tubes and lines as described. Electronically Signed   By: Alcide Clever M.D.   On: 03-18-2019 02:18    Consults: Palliative care  Subjective:    Overnight Issues: Lightly sedated on fentanyl, able to track and interact with family.  No agitation  Objective:  Vital signs for last 24 hours: Temp:  [97.6 F (36.4 C)-98.5 F (36.9 C)] 97.6 F (36.4 C) (02/03 2100) Pulse Rate:  [80-117] 96 (02/03 2100) Resp:  [12-19] 14 (02/03 2100) BP: (102-173)/(53-98) 134/74 (02/03 2100) SpO2:  [94 %-99 %] 94 % (02/03 2100) FiO2 (%):  [30 %] 30 % (02/03 2000)  Hemodynamic parameters for last 24 hours:    Intake/Output from previous day: 02/02 0701 - 02/03 0700 In: 832.4 [I.V.:582.4; IV Piggyback:250] Out: 20 [Urine:20]  Intake/Output this shift: Total I/O In: 390 [I.V.:140; IV Piggyback:250] Out: -   Vent settings for last 24 hours:  Vent Mode: PRVC FiO2 (%):  [30 %] 30 % Set Rate:  [14 bmp] 14 bmp Vt Set:  [500 mL] 500 mL PEEP:  [5 cmH20] 5 cmH20  Physical Exam:  General:  Chronically ill appearing male, intubated, was able to interact with the patient, can track, cannot follow commands consistently. Neuro:  Calm this morning, not following commands consistently, right hemiparesis (dense)  HEENT: supple, no JVD, trachea midline no crepitus  Cardiovascular: sinus tach, BBB, no R/G Lungs: rhonchi throughout, even, non labored,secretions through ET tube  decreased Abdomen: +BS x4, obese, soft, no grimacing on palpation, non distended, right inguinal hernia, reducible Musculoskeletal:  Decreased muscle tone on the right, 1+ anasarca Skin: Skin wounds as noted previously on right foot.  No rashes or  petechiae  Assessment/Plan:   Acute hypoxic respiratory failure secondary to aspiration pneumonia  On mechanical ventilation Full vent support for now-vent settings reviewed with RT Transitioning to comfort care in the morning VAP bundle implemented, continue for now IV steroids wean as tolerated  Scheduled ipratropium to control secretions, continue Glycopyrrolate for control of secretions  Essential HTN  Hx: DVT/PE requiring IVC filter, CVA with Right Sided Deficits, Pacemaker, and Recurrent Syncopal Episodes  Continuous telemetry monitoring  Echo interpretation pending  Continue outpatient  aspirin, and atorvastatin  Continue to hold outpatient xarelto, metoprolol, and lisinopril for now   Leukocytosis secondary to pneumonia  Trend WBC and monitor fever curve Trend PCT  Follow cultures  Continue cefepime   Urinary retention  Foley catheter working  Type II Diabetes Mellitus  CBG's q4hrs  SSI   Acute encephalopathy likely secondary to hypoxia  Mechanical ventilation pain/discomfort Maintain RASS goal 0 to -1  On fentanyl to maintain RASS goal and manage ventilator discomfort CT Head shows extensive encephalomalacia from prior CVAs possible new infarcts  Best Practice: VTE px: SCD's, will hold chemical prophylaxis for now, has IVC filter  SUP px: iv famotidine Diet: On tube feeds  Goals of care discussion: See yesterday's note, being transitioned to comfort care tomorrow.   LOS: 2 days   Additional comments: Discussed during multidisciplinary rounds.  Discussed with Palliative Care.  Discussed with family at bedside.  Critical Care Total Time*: 35 minutes  C. Danice Goltz, MD [facility] 03/10/2019  *Care during the described time interval was provided by me and/or other providers on the critical care team.  I have reviewed this patient's available data, including medical history, events of note, physical examination and test results as part of my  evaluation.

## 2019-03-10 NOTE — Progress Notes (Signed)
Palliative:  HPI: 86 y.o. male  with past medical history of recent left ischemic stroke with right sided deficits, PE/DVT requiring IVC filter, PAD, insomnia, HTN, recurrent UTI's, type 2 diabetes, sick sinus syndrome with pacemaker admitted on 03/29/2019 from [facility] with shortness of breath, hypoxia, fever with pneumonia and requiring intubation. Overall failure to thrive with significant decline since recent stroke.   I met today at Russell Patrick bedside. Received update from RN and he is tolerating fentanyl infusion and I have added some further medications to ensure comfort and increased Robinul for secretion management prior to one way extubation.   I met with children Russell Patrick and Russell Patrick at bedside. They confirm that their sister Russell Patrick is en-route from [place] and should be here later today. They would like to have time with him and would like to plan for extubation tomorrow morning instead of today. They would like for myself and Dr. Patsey Berthold to be available during this time as we are familiar with patient and family. I reassured them that this is not a problem and I have added the children Russell Patrick, Russell Patrick, and Russell Patrick) to visitation list. All questions/concerns addressed. Emotional support provided. Updated RN and Dr. Patsey Berthold.   Exam: Alert on vent but not following commands, does not track. Thin, frail, elderly. Still with a lot of secretions but not as copious as yesterday. Breathing regular, unlabored on vent. Abd soft, flat.   Plan: - One way extubation to comfort care planned for in the morning (no specific time arranged - I will check in with family in the morning).  - Increased Robinul. Continue fentanyl infusion and bolus via infusion as needed. Ativan as needed for agitation/anxiety. I will continue these medications to ensure comfort at time of extubation and adjust as needed to ensure comfort.   25 min  Vinie Sill, NP Palliative Medicine Team Pager [phone] (Please see  [email or web address] for schedule) Team Phone [phone]    Greater than 50%  of this time was spent counseling and coordinating care related to the above assessment and plan

## 2019-03-11 DIAGNOSIS — J9601 Acute respiratory failure with hypoxia: Secondary | ICD-10-CM

## 2019-03-11 LAB — ECHOCARDIOGRAM COMPLETE
Height: 72 in
Weight: 2761.92 oz

## 2019-03-11 LAB — GLUCOSE, CAPILLARY
Glucose-Capillary: 104 mg/dL — ABNORMAL HIGH (ref 70–99)
Glucose-Capillary: 132 mg/dL — ABNORMAL HIGH (ref 70–99)
Glucose-Capillary: 133 mg/dL — ABNORMAL HIGH (ref 70–99)
Glucose-Capillary: 76 mg/dL (ref 70–99)

## 2019-03-11 MED ORDER — BIOTENE DRY MOUTH MT LIQD: 15.0000 mL | OROMUCOSAL | Status: DC | PRN

## 2019-03-11 MED ORDER — MORPHINE 100MG IN NS 100ML (1MG/ML) PREMIX INFUSION
3.0000 mg/h | INTRAVENOUS | Status: DC
  Administered 2019-03-11: 3 mg/h via INTRAVENOUS
  Administered 2019-03-12 – 2019-03-15 (×4): 4 mg/h via INTRAVENOUS
  Filled 2019-03-11 (×6): qty 100

## 2019-03-11 MED ORDER — POLYVINYL ALCOHOL 1.4 % OP SOLN
1.0000 [drp] | Freq: Four times a day (QID) | OPHTHALMIC | Status: DC | PRN
  Filled 2019-03-11: qty 15

## 2019-03-11 MED ORDER — ACETAMINOPHEN 650 MG RE SUPP: 650.0000 mg | Freq: Four times a day (QID) | RECTAL | Status: DC | PRN

## 2019-03-11 MED ORDER — LORAZEPAM 2 MG/ML IJ SOLN: 0.5000 mg | INTRAMUSCULAR | Status: DC | PRN

## 2019-03-11 MED ORDER — MORPHINE BOLUS VIA INFUSION
2.0000 mg | INTRAVENOUS | Status: DC | PRN
  Filled 2019-03-11: qty 4

## 2019-03-11 NOTE — Progress Notes (Signed)
Follow up - Critical Care Medicine Note  Patient Details:    Russell Patrick is an 86 y.o. male with multiple prior CVAs and resultant dysphagia, admitted with acute hypoxic respiratory failure due to aspiration pneumonia and acute encephalopathy due to severe hypoxia requiring intubation and mechanical ventilation.   Lines, Airways, Drains: Airway 7.5 mm (Active)  Secured at (cm) 25 cm 03/09/19 1110  Measured From Lips 03/09/19 1200  Secured Location Left 03/09/19 1200  Secured By [employer] 03/09/19 1200  Tube Holder Repositioned Yes 03/09/19 1110  Cuff Pressure (cm H2O) 26 cm H2O 03/09/19 0758  Site Condition Dry 03/09/19 1110     NG/OG Tube Orogastric 18 Fr. Center mouth Xray;Aucultation Documented cm marking at nare/ corner of mouth 55 cm (Active)  Cm Marking at Nare/Corner of Mouth (if applicable) 55 cm 03/09/19 0736  Site Assessment Clean;Dry;Intact 03/09/19 1200  Ongoing Placement Verification No acute changes, not attributed to clinical condition;No change in cm markings or external length of tube from initial placement;No change in respiratory status 03/09/19 1200  Status Clamped 03/09/19 1200  Intake (mL) 30 mL 03/09/19 0230  Output (mL) 0 mL 03/09/19 0105     Urethral Catheter Imma RN Straight-tip 16 Fr. (Active)  Indication for Insertion or Continuance of Catheter Unstable critically ill patients first 24-48 hours (See Criteria) 03/09/19 1047  Site Assessment Clean;Intact 03/09/19 1047  Catheter Maintenance Bag below level of bladder 03/09/19 1047  Collection Container Standard drainage bag 03/09/19 1047  Securement Method Leg strap 03/09/19 1047  Urinary Catheter Interventions (if applicable) Unclamped 03/09/19 1047  Output (mL) 75 mL 03/09/19 0500    Anti-infectives:  Anti-infectives (From admission, onward)   Start     Dose/Rate Route Frequency Ordered Stop   03/23/2019 1600  ceFEPIme (MAXIPIME) 2 g in sodium chloride 0.9 % 100 mL IVPB  Status:   Discontinued     2 g 200 mL/hr over 30 Minutes Intravenous Every 12 hours 03/19/2019 0512 03/11/19 1006   03/17/2019 0330  vancomycin (VANCOCIN) IVPB 1000 mg/200 mL premix     1,000 mg 200 mL/hr over 60 Minutes Intravenous  Once 03/18/2019 0325 03/14/2019 0644   03/14/2019 0215  ceFEPIme (MAXIPIME) 2 g in sodium chloride 0.9 % 100 mL IVPB     2 g 200 mL/hr over 30 Minutes Intravenous  Once 03/15/2019 0211 03/13/2019 0500   03/30/2019 0215  metroNIDAZOLE (FLAGYL) IVPB 500 mg     500 mg 100 mL/hr over 60 Minutes Intravenous  Once 03/15/2019 0211 03/15/2019 0339   03/09/2019 0215  vancomycin (VANCOCIN) IVPB 1000 mg/200 mL premix     1,000 mg 200 mL/hr over 60 Minutes Intravenous  Once 04/03/2019 0211 03/28/2019 9833      Microbiology: Results for orders placed or performed during the hospital encounter of 03/15/2019  Blood Culture (routine x 2)     Status: None (Preliminary result)   Collection Time: 03/21/2019  1:48 AM   Specimen: BLOOD LEFT HAND  Result Value Ref Range Status   Specimen Description BLOOD LEFT HAND  Final   Special Requests   Final    Blood Culture results may not be optimal due to an inadequate volume of blood received in culture bottles   Culture   Final    NO GROWTH 3 DAYS Performed at [facility], [address] [place], [place] [address]    Report Status PENDING  Incomplete  Blood Culture (routine x 2)     Status: None (Preliminary result)  Collection Time: 03/18/2019  1:53 AM   Specimen: BLOOD LEFT HAND  Result Value Ref Range Status   Specimen Description BLOOD LEFT HAND  Final   Special Requests Blood Culture adequate volume  Final   Culture   Final    NO GROWTH 3 DAYS Performed at [facility], [address] [place], [place] [address]    Report Status PENDING  Incomplete  Respiratory Panel by RT PCR (Flu A&B, Covid) - Nasopharyngeal Swab     Status: None   Collection Time: 03/30/2019  2:28 AM   Specimen: Nasopharyngeal Swab  Result Value Ref Range Status    SARS Coronavirus 2 by RT PCR NEGATIVE NEGATIVE Final    Comment: (NOTE) SARS-CoV-2 target nucleic acids are NOT DETECTED. The SARS-CoV-2 RNA is generally detectable in upper respiratoy specimens during the acute phase of infection. The lowest concentration of SARS-CoV-2 viral copies this assay can detect is 131 copies/mL. A negative result does not preclude SARS-Cov-2 infection and should not be used as the sole basis for treatment or other patient management decisions. A negative result may occur with  improper specimen collection/handling, submission of specimen other than nasopharyngeal swab, presence of viral mutation(s) within the areas targeted by this assay, and inadequate number of viral copies (<131 copies/mL). A negative result must be combined with clinical observations, patient history, and epidemiological information. The expected result is Negative. Fact Sheet for Patients:  https://www.moore.com/ Fact Sheet for Healthcare Providers:  https://www.young.biz/ This test is not yet ap proved or cleared by the [place] FDA and  has been authorized for detection and/or diagnosis of SARS-CoV-2 by FDA under an Emergency Use Authorization (EUA). This EUA will remain  in effect (meaning this test can be used) for the duration of the COVID-19 declaration under Section 564(b)(1) of the Act, 21 U.S.C. section 360bbb-3(b)(1), unless the authorization is terminated or revoked sooner.    Influenza A by PCR NEGATIVE NEGATIVE Final   Influenza B by PCR NEGATIVE NEGATIVE Final    Comment: (NOTE) The Xpert Xpress SARS-CoV-2/FLU/RSV assay is intended as an aid in  the diagnosis of influenza from Nasopharyngeal swab specimens and  should not be used as a sole basis for treatment. Nasal washings and  aspirates are unacceptable for Xpert Xpress SARS-CoV-2/FLU/RSV  testing. Fact Sheet for Patients: https://www.moore.com/ Fact  Sheet for Healthcare Providers: https://www.young.biz/ This test is not yet approved or cleared by the [place] FDA and  has been authorized for detection and/or diagnosis of SARS-CoV-2 by  FDA under an Emergency Use Authorization (EUA). This EUA will remain  in effect (meaning this test can be used) for the duration of the  Covid-19 declaration under Section 564(b)(1) of the Act, 21  U.S.C. section 360bbb-3(b)(1), unless the authorization is  terminated or revoked. Performed at [facility], [address] [place], [place] [address]   MRSA PCR Screening     Status: None   Collection Time: 03/21/2019  8:20 AM   Specimen: Nasopharyngeal  Result Value Ref Range Status   MRSA by PCR NEGATIVE NEGATIVE Final    Comment:        The GeneXpert MRSA Assay (FDA approved for NASAL specimens only), is one component of a comprehensive MRSA colonization surveillance program. It is not intended to diagnose MRSA infection nor to guide or monitor treatment for MRSA infections. Performed at [facility], [address] [place], [place] [address]     Best Practice/Protocols:  VTE Prophylaxis: Mechanical GI Prophylaxis: Antihistamine  Intermittent Sedation  Events: 2/01 admitted due to acute hypoxic respiratory failure, intubated mechanically ventilated 2/02 remains on vent.  Copious secretions glycopyrrolate initiated 2/03 remains on vent.  Secretions were manageable.  Family making end-of-life decisions 2/04 patient transition to comfort care.   Studies: CT HEAD WO CONTRAST  Result Date: 03/24/19 CLINICAL DATA:  Altered mental status. EXAM: CT HEAD WITHOUT CONTRAST TECHNIQUE: Contiguous axial images were obtained from the base of the skull through the vertex without intravenous contrast. COMPARISON:  MRI 08/30/2010 FINDINGS: Brain: Age related cerebral atrophy, ventriculomegaly and periventricular white matter disease. Remote infarcts are noted,  most notably in a watershed area in the left frontoparietal region. White matter changes versus small lacunar type brainstem infarcts of uncertain age. No mass lesions or extra-axial fluid collections. Vascular: Vascular calcifications but no aneurysm or hyperdense vessels. Skull: No skull fracture or bone lesions. Sinuses/Orbits: Scattered ethmoid and maxillary sinus disease. The mastoid air cells and middle ear cavities are clear. Other: No scalp lesions or hematoma. IMPRESSION: 1. Age related cerebral atrophy, ventriculomegaly and periventricular white matter disease. 2. Remote infarcts, most notably in the left frontoparietal region. 3. White matter changes versus small lacunar type brainstem infarcts of uncertain age. 4. No mass lesions or extra-axial fluid collections. Electronically Signed   By: Rudie Meyer M.D.   On: 24-Mar-2019 06:05   DG Chest Port 1 View  Result Date: 03/09/2019 CLINICAL DATA:  Respiratory failure. EXAM: PORTABLE CHEST 1 VIEW COMPARISON:  March 24, 2019 1.  04/08/2008. FINDINGS: Endotracheal tube and NG tube in stable position. Cardiac pacer with lead tips over the right atrium and right ventricle. Heart size normal. Interstitial prominence is again noted bilaterally. Interstitial process such as pneumonitis could present this fashion. Slight improved aeration in the left lung base noted on today's exam. No prominent pleural effusion. No pneumothorax. IMPRESSION: 1.  Endotracheal tube and NG tube in stable position. 2.  Cardiac pacer stable position.  Heart size normal. 3. Diffuse bilateral interstitial infiltrates again noted. Slight improvement in aeration in the left lung base noted on today's exam. Electronically Signed   By: Maisie Fus  Register   On: 03/09/2019 11:28   DG Chest Port 1 View  Result Date: 2019/03/24 CLINICAL DATA:  Check endotracheal tube placement EXAM: PORTABLE CHEST 1 VIEW COMPARISON:  09/14/2013 FINDINGS: Cardiac shadow is within normal limits. Endotracheal tube is  seen in satisfactory position. Gastric catheter is noted with the tip in the stomach although the proximal side port lies at the GE junction. This could be advanced several cm further into the stomach. Lungs demonstrate patchy bibasilar infiltrates left greater than right. No bony abnormality is seen. IMPRESSION: Bibasilar infiltrates. Tubes and lines as described. Electronically Signed   By: Alcide Clever M.D.   On: 03/24/2019 02:18    Consults: Palliative care  Subjective:    Overnight Issues: Lightly sedated on fentanyl, able to track and interact with family.  No agitation  Objective:  Vital signs for last 24 hours: Temp:  [97.4 F (36.3 C)-98.5 F (36.9 C)] 97.4 F (36.3 C) (02/04 1200) Pulse Rate:  [93-123] 111 (02/04 1700) Resp:  [7-16] 7 (02/04 1700) BP: (119-173)/(62-95) 147/77 (02/04 0900) SpO2:  [79 %-99 %] 92 % (02/04 1700) FiO2 (%):  [30 %] 30 % (02/04 0805)  Hemodynamic parameters for last 24 hours:    Intake/Output from previous day: 02/03 0701 - 02/04 0700 In: 1228.9 [I.V.:978.9; IV Piggyback:250] Out: 900 [Urine:900]  Intake/Output this shift: Total I/O In: 689.3 [I.V.:589.3; IV Piggyback:100]  Out: -   Vent settings for last 24 hours: Vent Mode: PRVC FiO2 (%):  [30 %] 30 % Set Rate:  [14 bmp] 14 bmp Vt Set:  [500 mL] 500 mL PEEP:  [5 cmH20] 5 cmH20 Plateau Pressure:  [16 cmH20-18 cmH20] 16 cmH20  Physical Exam:  General:  Chronically ill appearing male, extubated, appears comfortable has transitioned to comfort care Neuro:  Calm, no respiratory distress, comfortable, sedated  Cardiovascular: sinus tach, BBB on monitor, no R/G Lungs: rhonchi throughout, even, non labored, occasional loud snoring Abdomen:  obese, soft, no grimacing on palpation, non distended Musculoskeletal:  Decreased muscle tone on the right, 1+ anasarca Skin: No new rashes or petechiae  Assessment/Plan:   Acute hypoxic respiratory failure secondary to aspiration pneumonia   Weaned to comfort care Scheduled ipratropium to control secretions, continue Glycopyrrolate for control of secretions  Essential HTN  Hx: DVT/PE requiring IVC filter, CVA with Right Sided Deficits, Pacemaker, and Recurrent Syncopal Episodes  Holding antihypertensives as transitioned to comfort care  Urinary retention  Foley catheter working Continue Foley catheter for end-of-life care  Type II Diabetes Mellitus  Glucose monitoring discontinued to comfort care    Goals of care discussion: Transitioned to comfort care, family at bedside.  Discussed with Palliative Care.   LOS: 3 days   Additional comments: Discussed during multidisciplinary rounds.  Discussed with Palliative Care.  Discussed with family at bedside.  Patient now comfort care.  Critical Care Total Time*:  C. Derrill Kay, MD [place] [facility] 03/11/2019  *Care during the described time interval was provided by me and/or other providers on the critical care team.  I have reviewed this patient's available data, including medical history, events of note, physical examination and test results as part of my evaluation.

## 2019-03-11 NOTE — Progress Notes (Signed)
10:00am:  CH consulted w/Palliative Care NP re: pt.'s comfort care status and one-way extubation this AM; family in pt.'s rm. and waiting rm, NP shared. CH visited pt.'s rm. and talked briefly w/ pt.'s two dtrs @ bedside.  Dtrs. shared pt. had just had breathing tube removed; no explicit needs at this time. CH met pt.'s two sons (Walter and Frank) and Walter's wife Zuma in ICU waiting room.  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  Shared that pt. had suffered multiple strokes in the last few years, each 'taking a little bit more of him away.'  Family confident in pt.'s wishes re: no long-term mechanical prolonging of life.  CH is available as needed and will follow up w/family.  12:15pm:  CH visited pt.'s rm. again after encountering sons and grandson (Eddie) in ICU hallway.  Grandson entered rm. w/rest of family (all family @ bedside now); after some time, grandson led family in prayer, hand on pt.'s forehead; grandson offered prayer of thanks for pt.'s 'example of service' to his family, and for comfort and peace in his transition.  CH provided ministry of presence with family for some time; after leaving rm. CH coordinated w/ charge RN to obtain bereavement tray of food/drinks brought for family.  2:30pm: CH cisited pt.'s room while rounding as follow-up; all family still present @ bedside except grandson.  CH provided informal life review of pt. and social/emotional support to family. Dtrs. shared her parents used to live in [place] and then moved home to [place]; family settled in [place] after dtr. moved here approx. 30 yrs. ago. CH made family aware of availability as needed; family still @ bedside when CH left.        03/11/19 1430  Clinical Encounter Type  Visited With Patient and family together  Visit Type Follow-up;Initial;Spiritual support;Patient actively dying;Critical Care;Social support   Referral From Physician;Palliative care team  Consult/Referral To Chaplain  Spiritual Encounters  Spiritual Needs Prayer;Emotional;Grief support  Stress Factors  Family Stress Factors Health changes;Loss   

## 2019-03-11 NOTE — Progress Notes (Signed)
Palliative:  HPI: 86 y.o.malewith past medical history of recent left ischemic stroke with right sided deficits, PE/DVT requiring IVC filter, PAD, insomnia, HTN, recurrent UTI's, type 2 diabetes, sick sinus syndrome with pacemakerad[facility], [facility], fever with pneumonia and requiring intubation.Overall failure to thrive with significant decline since recent stroke. 03/11/19 extubated to comfort care.   I met today at Mr. Calabretta bedside and discussed with his family. They are spending time with before one way extubation. Allowed them privacy to visit.   I returned to be present during extubation. Mr. Math received bolus for comfort and fentanyl infusion was left infusing to ensure continued comfort. Family are very concerned and do not want him to suffer at all. I reassured them that we will have everything in place to assist with his comfort throughout this process. We did discuss prognosis could be hours but most likely days but we will see how he does over the next few hours. He was extubated successfully and remains on fentanyl infusion and he is breathing regular but shallow and oxygen is a little low. Family is pleased that he appears just as he does when he is sleeping. I updated other family that have decided not to be in room to the same above. I discussed with all family signs and symptoms with progression at end of life as well as signs of discomfort.   All questions/concerns addressed. Emotional support provided. Dr. Patsey Berthold and chaplain updated and following.   Exam: Arousable. Pale. No distress. Breathing shallow and regular on room air. Abd soft. Generalized weakness. Warm to touch.   Plan: - Extubated to full comfort care.  - Continue with fentanyl infusion to ensure comfort.  - Comfort meds PRN and may be titrated up dose/frequency as needed to achieve comfort.   [place], NP Palliative Medicine Team Pager  [phone] (Please see [email or web address] for schedule) Team Phone [phone]    Greater than 50%  of this time was spent counseling and coordinating care related to the above assessment and plan

## 2019-03-11 NOTE — Progress Notes (Signed)
Observed foley was not having urine in, bladder scanned read 763, FOLEY CHECKED, PULLED BACK SOME AND REINFLATED , OBTAINED 850 OF AMBER URINE, WILL CONTINUE TO MONITOR, BLADDER SCANNED AGAIN READ ZERO.

## 2019-03-11 NOTE — Progress Notes (Signed)
Pt was suctioned for a small amount of thick white secretions. Per Helmut Muster Parker's order, he was extubated to comfort care.

## 2019-03-12 DIAGNOSIS — J189 Pneumonia, unspecified organism: Secondary | ICD-10-CM

## 2019-03-12 MED ORDER — GLYCOPYRROLATE 0.2 MG/ML IJ SOLN
0.6000 mg | INTRAMUSCULAR | Status: DC
  Administered 2019-03-12 – 2019-03-16 (×20): 0.6 mg via INTRAVENOUS
  Filled 2019-03-12 (×30): qty 3

## 2019-03-12 NOTE — Care Management Important Message (Signed)
Important Message  Patient Details  Name: Russell Patrick MRN: [id number] Date of Birth: 08-29-1932   Medicare Important Message Given:  Yes     [name] A Antanasia Kaczynski 03/12/2019, 10:39 AM

## 2019-03-12 NOTE — Progress Notes (Signed)
Palliative:  HPI:86 y.o.malewith past medical history of recent left ischemic stroke with right sided deficits, PE/DVT requiring IVC filter, PAD, insomnia, HTN, recurrent UTI's, type 2 diabetes, sick sinus syndrome with pacemakerad[facility], [facility], fever with pneumonia and requiring intubation.Overall failure to thrive with significant decline since recent stroke.03/11/19 extubated to comfort care.   I met today at Russell Patrick bedside with his family. His secretions have worsened and he is slowly progressing at end of life. Breathing is a little more irregular and shallow at times. He is warm to touch and vitals remain overall stable. Explained all this to family and will increase Russell Patrick to assist with secretions. Explained signs of discomfort vs signs at end of life that may be uncomfortable to witness such as secretions. They have good understanding. Anticipate hospital death.   All questions/concerns addressed. Emotional support provided to family at bedside. Discussed care with bedside RN.   Exam: Less responsive. Breathing slightly labored at times - morphine bolus provided. Breathing mor irregular today. More congestion and secretions. Warms to touch.   Plan: - Continue with comfort care.  - May increase dose/frequency of medications as needed to achieve comfort.   25 min  Russell Sill, NP Palliative Medicine Team Pager [phone] (Please see [email or web address] for schedule) Team Phone [phone]    Greater than 50%  of this time was spent counseling and coordinating care related to the above assessment and plan

## 2019-03-12 NOTE — Progress Notes (Addendum)
Progress Note    Russell Patrick  CZY:[id number] DOB: 1932-02-09  DOA: 03/12/2019 PCP: Marisa Hua, MD     Russell Patrick is an 86 y.o. male with multiple prior CVAs and resultant dysphagia, admitted with acute hypoxic respiratory failure due to aspiration pneumonia and acute encephalopathy due to severe hypoxia requiring intubation and mechanical ventilation.    Assessment/Plan:   Active Problems:   Acute respiratory failure ([place])   Community acquired pneumonia   Goals of care, counseling/discussion   Palliative care encounter  Acute hypoxemic respiratory failure secondary to aspiration pneumonia: Oxygen support as needed for comfort.  Patient is currently on comfort measures.  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   Family Communication/Anticipated D/C date and plan/Code Status   DVT prophylaxis: comfort care Code Status: DNR Family Communication: None Disposition Plan: comfort care with hospice      Subjective:   Patient is non verbal and unable to provide any history  Objective:    Vitals:   03/11/19 1600 03/11/19 1630 03/11/19 1700 03/12/19 0823  BP:    101/81  Pulse: 97 95 (!) 111 89  Resp: 13 10 (!) 7 14  Temp:    98.7 F (37.1 C)  TempSrc:    Oral  SpO2: (!) 89% 91% 92% (!) 86%  Weight:      Height:        Intake/Output Summary (Last 24 hours) at 03/12/2019 1805 Last data filed at 03/12/2019 1501 Gross per 24 hour  Intake 79.3 ml  Output 575 ml  Net -495.7 ml   Filed Weights   03/23/2019 0149 03/09/2019 1034  Weight: 82 kg 78.3 kg    Exam:  GEN: NAD SKIN: Warm and dry EYES: no pallor or  icterus ENT: MMM CV: RRR PULM: CTA B ABD: soft, ND, +BS CNS: lethargic/unresponsive EXT: Edema of b/l upper extemitites     Data Reviewed:   I have personally reviewed following labs and imaging studies:  Labs: Labs show the following:   Basic Metabolic Panel: Recent Labs  Lab 03/29/2019 0148 03/17/2019 0148 03/09/19 0429 03/10/19 0501  NA 143  --  143 146*  K 3.6   < > 3.7 4.5  CL 114*  --  109 112*  CO2 22  --  22 23  GLUCOSE 214*  --  203* 169*  BUN 25*  --  34* 42*  CREATININE 1.24  --  1.34* 1.33*  CALCIUM 7.6*  --  8.5* 8.5*  MG  --   --  2.1 2.3  PHOS  --   --  3.8 4.4   < > = values in this interval not displayed.   GFR Estimated Creatinine Clearance: 45.4 mL/min (A) (by C-G formula based on SCr of 1.33 mg/dL (H)). Liver Function Tests: Recent Labs  Lab 03/12/2019 0148 03/09/19 0429  AST 41  --   ALT 26  --   ALKPHOS 86  --   BILITOT 0.5  --   PROT 6.2*  --   ALBUMIN 2.2* 2.2*   No results for input(s): LIPASE, AMYLASE in the last 168 hours. No results for input(s): AMMONIA in the  last 168 hours. Coagulation profile Recent Labs  Lab 03/29/2019 0148 03/09/19 0429  INR 3.4* 2.0*    CBC: Recent Labs  Lab 03/09/2019 0148 03/09/19 0429 03/10/19 0501  WBC 15.5* 26.3* 26.8*  NEUTROABS 12.5*  --  23.4*  HGB 11.4* 11.1* 11.3*  HCT 34.5* 35.1* 36.0*  MCV 89.6 91.2 92.8  PLT 321 335 352   Cardiac Enzymes: No results for input(s): CKTOTAL, CKMB, CKMBINDEX, TROPONINI in the last 168 hours. BNP (last 3 results) No results for input(s): PROBNP in the last 8760 hours. CBG: Recent Labs  Lab 03/10/19 1945 03/11/19 0102 03/11/19 0418 03/11/19 0730 03/11/19 0736  GLUCAP 88 104* 132* 133* 76   D-Dimer: No results for input(s): DDIMER in the last 72 hours. Hgb A1c: No results for input(s): HGBA1C in the last 72 hours. Lipid Profile: No results for input(s): CHOL, HDL, LDLCALC, TRIG, CHOLHDL, LDLDIRECT in the last 72 hours. Thyroid function  studies: No results for input(s): TSH, T4TOTAL, T3FREE, THYROIDAB in the last 72 hours.  Invalid input(s): FREET3 Anemia work up: No results for input(s): VITAMINB12, FOLATE, FERRITIN, TIBC, IRON, RETICCTPCT in the last 72 hours. Sepsis Labs: Recent Labs  Lab 03/24/2019 0148 03/29/2019 0520 03/09/19 0429 03/10/19 0501  PROCALCITON 0.34  --   --   --   WBC 15.5*  --  26.3* 26.8*  LATICACIDVEN 1.0 2.2*  --   --     Microbiology Recent Results (from the past 240 hour(s))  Blood Culture (routine x 2)     Status: None (Preliminary result)   Collection Time: 03/19/2019  1:48 AM   Specimen: BLOOD LEFT HAND  Result Value Ref Range Status   Specimen Description BLOOD LEFT HAND  Final   Special Requests   Final    Blood Culture results may not be optimal due to an inadequate volume of blood received in culture bottles   Culture   Final    NO GROWTH 4 DAYS Performed at [facility], [address] [place], [place] [address]    Report Status PENDING  Incomplete  Blood Culture (routine x 2)     Status: None (Preliminary result)   Collection Time: 04/03/2019  1:53 AM   Specimen: BLOOD LEFT HAND  Result Value Ref Range Status   Specimen Description BLOOD LEFT HAND  Final   Special Requests Blood Culture adequate volume  Final   Culture   Final    NO GROWTH 4 DAYS Performed at [facility], [address], [place],  [address]    Report Status PENDING  Incomplete  Respiratory Panel by RT PCR (Flu A&B, Covid) - Nasopharyngeal Swab     Status: None   Collection Time: 03/24/2019  2:28 AM   Specimen: Nasopharyngeal Swab  Result Value Ref Range Status   SARS Coronavirus 2 by RT PCR NEGATIVE NEGATIVE Final    Comment: (NOTE) SARS-CoV-2 target nucleic acids are NOT DETECTED. The SARS-CoV-2 RNA is generally detectable in upper respiratoy specimens during the acute phase of infection. The lowest concentration of SARS-CoV-2 viral copies this assay can detect is 131  copies/mL. A negative result does not preclude SARS-Cov-2 infection and should not be used as the sole basis for treatment or other patient management decisions. A negative result may occur with  improper specimen collection/handling, submission of specimen other than nasopharyngeal swab, presence of viral mutation(s) within the areas targeted by this assay, and inadequate number of viral copies (<131 copies/mL). A negative result must be combined with  clinical observations, patient history, and epidemiological information. The expected result is Negative. Fact Sheet for Patients:  PinkCheek.be Fact Sheet for Healthcare Providers:  GravelBags.it This test is not yet ap proved or cleared by the [place] FDA and  has been authorized for detection and/or diagnosis of SARS-CoV-2 by FDA under an Emergency Use Authorization (EUA). This EUA will remain  in effect (meaning this test can be used) for the duration of the COVID-19 declaration under Section 564(b)(1) of the Act, 21 U.S.C. section 360bbb-3(b)(1), unless the authorization is terminated or revoked sooner.    Influenza A by PCR NEGATIVE NEGATIVE Final   Influenza B by PCR NEGATIVE NEGATIVE Final    Comment: (NOTE) The Xpert Xpress SARS-CoV-2/FLU/RSV assay is intended as an aid in  the diagnosis of influenza from Nasopharyngeal swab specimens and  should not be used as a sole basis for treatment. Nasal washings and  aspirates are unacceptable for Xpert Xpress SARS-CoV-2/FLU/RSV  testing. Fact Sheet for Patients: PinkCheek.be Fact Sheet for Healthcare Providers: GravelBags.it This test is not yet approved or cleared by the [place] FDA and  has been authorized for detection and/or diagnosis of SARS-CoV-2 by  FDA under an Emergency Use Authorization (EUA). This EUA will remain  in effect (meaning this test can  be used) for the duration of the  Covid-19 declaration under Section 564(b)(1) of the Act, 21  U.S.C. section 360bbb-3(b)(1), unless the authorization is  terminated or revoked. Performed at [facility], [address] [place], [place] [address]   MRSA PCR Screening     Status: None   Collection Time: 03/13/2019  8:20 AM   Specimen: Nasopharyngeal  Result Value Ref Range Status   MRSA by PCR NEGATIVE NEGATIVE Final    Comment:        The GeneXpert MRSA Assay (FDA approved for NASAL specimens only), is one component of a comprehensive MRSA colonization surveillance program. It is not intended to diagnose MRSA infection nor to guide or monitor treatment for MRSA infections. Performed at [facility], [address] [place], [place] [address]     Procedures and diagnostic studies:  No results found.  Medications:   . chlorhexidine gluconate (MEDLINE KIT)  15 mL Mouth Rinse BID  . Chlorhexidine Gluconate Cloth  6 each Topical Daily  . glycopyrrolate  0.6 mg Intravenous Q4H  . mouth rinse  15 mL Mouth Rinse 10 times per day   Continuous Infusions: . lactated ringers 10 mL/hr at 03/11/19 1229  . morphine 4 mg/hr (03/12/19 1216)     LOS: 4 days   Shauntae Reitman  [facility]     03/12/2019, 6:05 PM

## 2019-03-13 LAB — CULTURE, BLOOD (ROUTINE X 2)
Culture: NO GROWTH
Culture: NO GROWTH
Special Requests: ADEQUATE

## 2019-03-13 MED ORDER — ORAL CARE MOUTH RINSE
15.0000 mL | Freq: Two times a day (BID) | OROMUCOSAL | Status: DC
  Administered 2019-03-14 – 2019-03-16 (×5): 15 mL via OROMUCOSAL

## 2019-03-13 NOTE — Progress Notes (Signed)
+  1 pitting edema in bilateral upper extremities. Both elevated on several blankets. IV site assessed. Infusing with no issues. Flushes with blood return. Will continue to monitor.

## 2019-03-13 NOTE — Progress Notes (Signed)
Progress Note    Russell Patrick  GTX:[id number] DOB: [birth year]  DOA: 03/31/2019 PCP: Marisa Hua, MD     Russell Patrick is an 86 y.o. male with multiple prior CVAs and resultant dysphagia, admitted with acute hypoxic respiratory failure due to aspiration pneumonia and acute encephalopathy due to severe hypoxia requiring intubation and mechanical ventilation.    Assessment/Plan:   Active Problems:   Acute respiratory failure ([place])   Community acquired pneumonia   Goals of care, counseling/discussion   Palliative care encounter  Acute hypoxemic respiratory failure secondary to aspiration pneumonia: Oxygen support as needed for comfort.  Patient is currently on comfort measures.  Continue IV morphine infusion for comfort.    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   Family Communication/Anticipated D/C date and plan/Code Status   DVT prophylaxis: comfort care Code Status: DNR Family Communication: None Disposition Plan: comfort care with hospice      Subjective:   Patient is non verbal and unable to provide any history  Objective:    Vitals:   03/11/19 1700 03/12/19 0823 03/13/19 0016 03/13/19 0737  BP:  101/81 135/66 102/65  Pulse: (!) 111 89 (!) 123 (!) 55  Resp: (!) 7 14 16 19   Temp:  98.7 F (37.1 C) 100.1 F (37.8 C) 98.1 F (36.7 C)  TempSrc:  Oral Oral Axillary  SpO2: 92% (!) 86% (!) 81% 92%  Weight:      Height:        Intake/Output Summary (Last 24 hours) at 03/13/2019 1608 Last data filed at 03/13/2019 0601 Gross per 24 hour  Intake --  Output 500 ml  Net -500 ml   Filed Weights   03/30/2019 0149 03/15/2019 1034  Weight: 82 kg 78.3 kg     Exam:  GEN: NAD SKIN: Warm and dry EYES: eyes closed ENT: MMM CV: RRR PULM: CTA B ABD: soft, ND, +BS CNS: unresponsive EXT: Swelling of bilateral upper extremities  Data Reviewed:   I have personally reviewed following labs and imaging studies:  Labs: Labs show the following:   Basic Metabolic Panel: Recent Labs  Lab 03/20/2019 0148 03/20/2019 0148 03/09/19 0429 03/10/19 0501  NA 143  --  143 146*  K 3.6   < > 3.7 4.5  CL 114*  --  109 112*  CO2 22  --  22 23  GLUCOSE 214*  --  203* 169*  BUN 25*  --  34* 42*  CREATININE 1.24  --  1.34* 1.33*  CALCIUM 7.6*  --  8.5* 8.5*  MG  --   --  2.1 2.3  PHOS  --   --  3.8 4.4   < > = values in this interval not displayed.   GFR Estimated Creatinine Clearance: 45.4 mL/min (A) (by C-G formula based on SCr of 1.33 mg/dL (H)). Liver Function Tests: Recent Labs  Lab 03/19/2019 0148 03/09/19 0429  AST 41  --   ALT 26  --   ALKPHOS 86  --   BILITOT 0.5  --   PROT 6.2*  --   ALBUMIN 2.2* 2.2*   No results for input(s): LIPASE, AMYLASE in the last 168 hours. No results for input(s): AMMONIA  in the last 168 hours. Coagulation profile Recent Labs  Lab 03/09/2019 0148 03/09/19 0429  INR 3.4* 2.0*    CBC: Recent Labs  Lab 04/04/2019 0148 03/09/19 0429 03/10/19 0501  WBC 15.5* 26.3* 26.8*  NEUTROABS 12.5*  --  23.4*  HGB 11.4* 11.1* 11.3*  HCT 34.5* 35.1* 36.0*  MCV 89.6 91.2 92.8  PLT 321 335 352   Cardiac Enzymes: No results for input(s): CKTOTAL, CKMB, CKMBINDEX, TROPONINI in the last 168 hours. BNP (last 3 results) No results for input(s): PROBNP in the last 8760 hours. CBG: Recent Labs  Lab 03/10/19 1945 03/11/19 0102 03/11/19 0418 03/11/19 0730 03/11/19 0736  GLUCAP 88 104* 132* 133* 76   D-Dimer: No results for input(s): DDIMER in the last 72 hours. Hgb A1c: No results for input(s): HGBA1C in the last 72 hours. Lipid Profile: No results for input(s): CHOL, HDL, LDLCALC, TRIG, CHOLHDL, LDLDIRECT  in the last 72 hours. Thyroid function studies: No results for input(s): TSH, T4TOTAL, T3FREE, THYROIDAB in the last 72 hours.  Invalid input(s): FREET3 Anemia work up: No results for input(s): VITAMINB12, FOLATE, FERRITIN, TIBC, IRON, RETICCTPCT in the last 72 hours. Sepsis Labs: Recent Labs  Lab 03/31/2019 0148 04/04/2019 0520 03/09/19 0429 03/10/19 0501  PROCALCITON 0.34  --   --   --   WBC 15.5*  --  26.3* 26.8*  LATICACIDVEN 1.0 2.2*  --   --     Microbiology Recent Results (from the past 240 hour(s))  Blood Culture (routine x 2)     Status: None   Collection Time: 03/19/2019  1:48 AM   Specimen: BLOOD LEFT HAND  Result Value Ref Range Status   Specimen Description BLOOD LEFT HAND  Final   Special Requests   Final    Blood Culture results may not be optimal due to an inadequate volume of blood received in culture bottles   Culture   Final    NO GROWTH 5 DAYS Performed at [facility], [address] [place], [place] [address]    Report Status 03/13/2019 FINAL  Final  Blood Culture (routine x 2)     Status: None   Collection Time: 03/13/2019  1:53 AM   Specimen: BLOOD LEFT HAND  Result Value Ref Range Status   Specimen Description BLOOD LEFT HAND  Final   Special Requests Blood Culture adequate volume  Final   Culture   Final    NO GROWTH 5 DAYS Performed at [facility], [address] [place], [place] [address]    Report Status 03/13/2019 FINAL  Final  Respiratory Panel by RT PCR (Flu A&B, Covid) - Nasopharyngeal Swab     Status: None   Collection Time: 03/20/2019  2:28 AM   Specimen: Nasopharyngeal Swab  Result Value Ref Range Status   SARS Coronavirus 2 by RT PCR NEGATIVE NEGATIVE Final    Comment: (NOTE) SARS-CoV-2 target nucleic acids are NOT DETECTED. The SARS-CoV-2 RNA is generally detectable in upper respiratoy specimens during the acute phase of infection. The lowest concentration of SARS-CoV-2 viral copies this assay can detect is 131  copies/mL. A negative result does not preclude SARS-Cov-2 infection and should not be used as the sole basis for treatment or other patient management decisions. A negative result may occur with  improper specimen collection/handling, submission of specimen other than nasopharyngeal swab, presence of viral mutation(s) within the areas targeted by this assay, and inadequate number of viral copies (<131 copies/mL). A negative result must be combined with  clinical observations, patient history, and epidemiological information. The expected result is Negative. Fact Sheet for Patients:  PinkCheek.be Fact Sheet for Healthcare Providers:  GravelBags.it This test is not yet ap proved or cleared by the [place] FDA and  has been authorized for detection and/or diagnosis of SARS-CoV-2 by FDA under an Emergency Use Authorization (EUA). This EUA will remain  in effect (meaning this test can be used) for the duration of the COVID-19 declaration under Section 564(b)(1) of the Act, 21 U.S.C. section 360bbb-3(b)(1), unless the authorization is terminated or revoked sooner.    Influenza A by PCR NEGATIVE NEGATIVE Final   Influenza B by PCR NEGATIVE NEGATIVE Final    Comment: (NOTE) The Xpert Xpress SARS-CoV-2/FLU/RSV assay is intended as an aid in  the diagnosis of influenza from Nasopharyngeal swab specimens and  should not be used as a sole basis for treatment. Nasal washings and  aspirates are unacceptable for Xpert Xpress SARS-CoV-2/FLU/RSV  testing. Fact Sheet for Patients: PinkCheek.be Fact Sheet for Healthcare Providers: GravelBags.it This test is not yet approved or cleared by the [place] FDA and  has been authorized for detection and/or diagnosis of SARS-CoV-2 by  FDA under an Emergency Use Authorization (EUA). This EUA will remain  in effect (meaning this test can  be used) for the duration of the  Covid-19 declaration under Section 564(b)(1) of the Act, 21  U.S.C. section 360bbb-3(b)(1), unless the authorization is  terminated or revoked. Performed at [facility], [address] [place], [place] [address]   MRSA PCR Screening     Status: None   Collection Time: 03/17/2019  8:20 AM   Specimen: Nasopharyngeal  Result Value Ref Range Status   MRSA by PCR NEGATIVE NEGATIVE Final    Comment:        The GeneXpert MRSA Assay (FDA approved for NASAL specimens only), is one component of a comprehensive MRSA colonization surveillance program. It is not intended to diagnose MRSA infection nor to guide or monitor treatment for MRSA infections. Performed at [facility], [address] [place], [place] [address]     Procedures and diagnostic studies:  No results found.  Medications:   . chlorhexidine gluconate (MEDLINE KIT)  15 mL Mouth Rinse BID  . Chlorhexidine Gluconate Cloth  6 each Topical Daily  . glycopyrrolate  0.6 mg Intravenous Q4H  . mouth rinse  15 mL Mouth Rinse 10 times per day   Continuous Infusions: . lactated ringers 10 mL/hr at 03/11/19 1229  . morphine 4 mg/hr (03/13/19 1350)     LOS: 5 days   [facility]     03/13/2019, 4:08 PM

## 2019-03-13 NOTE — Progress Notes (Addendum)
CH visited pt. on 1A after noticing family in rm. as follow-up from prior encounter in ICU Thursday.  Family reported pt. moved to 1A; they have been with him @ bedside since Thursday.  Family is aware of CH availability and will contact RN for [facility] page if needed. No further needs expressed at this time.       03/13/19 1700  Clinical Encounter Type  Visited With Patient and family together  Visit Type Follow-up;Social support;Patient actively dying  Consult/Referral To Chaplain  Spiritual Encounters  Spiritual Needs Emotional  Stress Factors  Family Stress Factors Loss;Health changes

## 2019-03-14 NOTE — Progress Notes (Signed)
   03/14/19 1250  Clinical Encounter Type  Visited With Patient and family together  Visit Type Follow-up  Referral From Chaplain  Consult/Referral To Chaplain  Spiritual Encounters  Spiritual Needs Prayer;Emotional  CH visited pt and family upon request from [facility]. Pt's family was present upon arrival. Pt was asleep throughout visit. Didn't display any signs of pain and daughter shared that pt seemed to be more comfortable than days prior. CH spent time building rapport with family before prayer. Daily visits will be appreciated.

## 2019-03-14 NOTE — Progress Notes (Signed)
Progress Note    Russell Patrick  ZJQ:[id number] DOB: [birth year]  DOA: 03/18/2019 PCP: Marisa Hua, MD     Russell Patrick is an 86 y.o. male with multiple prior CVAs and resultant dysphagia, admitted with acute hypoxic respiratory failure due to aspiration pneumonia and acute encephalopathy due to severe hypoxia requiring intubation and mechanical ventilation.    Assessment/Plan:   Active Problems:   Acute respiratory failure ([place])   Community acquired pneumonia   Goals of care, counseling/discussion   Palliative care encounter  Acute hypoxemic respiratory failure secondary to aspiration pneumonia: Oxygen support as needed for comfort.  Patient is currently on comfort measures.  Continue IV morphine infusion for comfort.    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      Body mass index is 23.41 kg/m.   Family Communication/Anticipated D/C date and plan/Code Status   DVT prophylaxis: comfort care Code Status: DNR Family Communication: None Disposition Plan: comfort care with hospice      Subjective:   Patient is non verbal and unable to provide any history  Objective:    Vitals:   03/12/19 0823 03/13/19 0016 03/13/19 0737 03/14/19 0732  BP: 101/81 135/66 102/65   Pulse: 89 (!) 123 (!) 55 85  Resp: 14 16 19    Temp: 98.7 F (37.1 C) 100.1 F (37.8 C) 98.1 F (36.7 C) 97.7 F (36.5 C)  TempSrc: Oral Oral Axillary Axillary  SpO2: (!) 86% (!) 81% 92% 92%  Weight:      Height:        Intake/Output Summary (Last 24 hours) at 03/14/2019 1519 Last data filed at 03/14/2019 0452 Gross per 24 hour  Intake 570.13 ml  Output 700 ml  Net -129.87  ml   Filed Weights   03/26/2019 0149 03/18/2019 1034  Weight: 82 kg 78.3 kg    Exam:  GEN: NAD SKIN: Warm and dry EYES: Pupils are minimally reactive to light  ENT: MMM CV: RRR PULM: CTA B ABD: soft, ND, +BS CNS: unresponsive EXT: Swelling of bilateral upper extremities  Data Reviewed:   I have personally reviewed following labs and imaging studies:  Labs: Labs show the following:   Basic Metabolic Panel: Recent Labs  Lab 03/22/2019 0148 03/09/2019 0148 03/09/19 0429 03/10/19 0501  NA 143  --  143 146*  K 3.6   < > 3.7 4.5  CL 114*  --  109 112*  CO2 22  --  22 23  GLUCOSE 214*  --  203* 169*  BUN 25*  --  34* 42*  CREATININE 1.24  --  1.34* 1.33*  CALCIUM 7.6*  --  8.5* 8.5*  MG  --   --  2.1 2.3  PHOS  --   --  3.8 4.4   < > = values in this interval not displayed.   GFR Estimated Creatinine Clearance: 45.4 mL/min (A) (by C-G formula based on SCr of 1.33 mg/dL (H)). Liver Function Tests: Recent Labs  Lab 03/11/2019 0148 03/09/19 0429  AST 41  --   ALT 26  --   ALKPHOS 86  --   BILITOT 0.5  --   PROT 6.2*  --   ALBUMIN 2.2* 2.2*  No results for input(s): LIPASE, AMYLASE in the last 168 hours. No results for input(s): AMMONIA in the last 168 hours. Coagulation profile Recent Labs  Lab 03/24/2019 0148 03/09/19 0429  INR 3.4* 2.0*    CBC: Recent Labs  Lab 03/14/2019 0148 03/09/19 0429 03/10/19 0501  WBC 15.5* 26.3* 26.8*  NEUTROABS 12.5*  --  23.4*  HGB 11.4* 11.1* 11.3*  HCT 34.5* 35.1* 36.0*  MCV 89.6 91.2 92.8  PLT 321 335 352   Cardiac Enzymes: No results for input(s): CKTOTAL, CKMB, CKMBINDEX, TROPONINI in the last 168 hours. BNP (last 3 results) No results for input(s): PROBNP in the last 8760 hours. CBG: Recent Labs  Lab 03/10/19 1945 03/11/19 0102 03/11/19 0418 03/11/19 0730 03/11/19 0736  GLUCAP 88 104* 132* 133* 76   D-Dimer: No results for input(s): DDIMER in the last 72 hours. Hgb A1c: No results for input(s): HGBA1C in  the last 72 hours. Lipid Profile: No results for input(s): CHOL, HDL, LDLCALC, TRIG, CHOLHDL, LDLDIRECT in the last 72 hours. Thyroid function studies: No results for input(s): TSH, T4TOTAL, T3FREE, THYROIDAB in the last 72 hours.  Invalid input(s): FREET3 Anemia work up: No results for input(s): VITAMINB12, FOLATE, FERRITIN, TIBC, IRON, RETICCTPCT in the last 72 hours. Sepsis Labs: Recent Labs  Lab 03/18/2019 0148 03/29/2019 0520 03/09/19 0429 03/10/19 0501  PROCALCITON 0.34  --   --   --   WBC 15.5*  --  26.3* 26.8*  LATICACIDVEN 1.0 2.2*  --   --     Microbiology Recent Results (from the past 240 hour(s))  Blood Culture (routine x 2)     Status: None   Collection Time: 03/27/2019  1:48 AM   Specimen: BLOOD LEFT HAND  Result Value Ref Range Status   Specimen Description BLOOD LEFT HAND  Final   Special Requests   Final    Blood Culture results may not be optimal due to an inadequate volume of blood received in culture bottles   Culture   Final    NO GROWTH 5 DAYS Performed at [facility], [address], [place], [place] [address]    Report Status 03/13/2019 FINAL  Final  Blood Culture (routine x 2)     Status: None   Collection Time: 03/12/2019  1:53 AM   Specimen: BLOOD LEFT HAND  Result Value Ref Range Status   Specimen Description BLOOD LEFT HAND  Final   Special Requests Blood Culture adequate volume  Final   Culture   Final    NO GROWTH 5 DAYS Performed at [facility], [address] [place], [place] [address]    Report Status 03/13/2019 FINAL  Final  Respiratory Panel by RT PCR (Flu A&B, Covid) - Nasopharyngeal Swab     Status: None   Collection Time: 03/09/2019  2:28 AM   Specimen: Nasopharyngeal Swab  Result Value Ref Range Status   SARS Coronavirus 2 by RT PCR NEGATIVE NEGATIVE Final    Comment: (NOTE) SARS-CoV-2 target nucleic acids are NOT DETECTED. The SARS-CoV-2 RNA is generally detectable in upper respiratoy specimens during the  acute phase of infection. The lowest concentration of SARS-CoV-2 viral copies this assay can detect is 131 copies/mL. A negative result does not preclude SARS-Cov-2 infection and should not be used as the sole basis for treatment or other patient management decisions. A negative result may occur with  improper specimen collection/handling, submission of specimen other than nasopharyngeal swab, presence of viral mutation(s) within the areas targeted by this  assay, and inadequate number of viral copies (<131 copies/mL). A negative result must be combined with clinical observations, patient history, and epidemiological information. The expected result is Negative. Fact Sheet for Patients:  PinkCheek.be Fact Sheet for Healthcare Providers:  GravelBags.it This test is not yet ap proved or cleared by the [place] FDA and  has been authorized for detection and/or diagnosis of SARS-CoV-2 by FDA under an Emergency Use Authorization (EUA). This EUA will remain  in effect (meaning this test can be used) for the duration of the COVID-19 declaration under Section 564(b)(1) of the Act, 21 U.S.C. section 360bbb-3(b)(1), unless the authorization is terminated or revoked sooner.    Influenza A by PCR NEGATIVE NEGATIVE Final   Influenza B by PCR NEGATIVE NEGATIVE Final    Comment: (NOTE) The Xpert Xpress SARS-CoV-2/FLU/RSV assay is intended as an aid in  the diagnosis of influenza from Nasopharyngeal swab specimens and  should not be used as a sole basis for treatment. Nasal washings and  aspirates are unacceptable for Xpert Xpress SARS-CoV-2/FLU/RSV  testing. Fact Sheet for Patients: PinkCheek.be Fact Sheet for Healthcare Providers: GravelBags.it This test is not yet approved or cleared by the [place] FDA and  has been authorized for detection and/or diagnosis of SARS-CoV-2  by  FDA under an Emergency Use Authorization (EUA). This EUA will remain  in effect (meaning this test can be used) for the duration of the  Covid-19 declaration under Section 564(b)(1) of the Act, 21  U.S.C. section 360bbb-3(b)(1), unless the authorization is  terminated or revoked. Performed at [facility], [address] [place], [place] [address]   MRSA PCR Screening     Status: None   Collection Time: 04/04/2019  8:20 AM   Specimen: Nasopharyngeal  Result Value Ref Range Status   MRSA by PCR NEGATIVE NEGATIVE Final    Comment:        The GeneXpert MRSA Assay (FDA approved for NASAL specimens only), is one component of a comprehensive MRSA colonization surveillance program. It is not intended to diagnose MRSA infection nor to guide or monitor treatment for MRSA infections. Performed at [facility], [address] [place],  [address]     Procedures and diagnostic studies:  No results found.  Medications:   . chlorhexidine gluconate (MEDLINE KIT)  15 mL Mouth Rinse BID  . Chlorhexidine Gluconate Cloth  6 each Topical Daily  . glycopyrrolate  0.6 mg Intravenous Q4H  . mouth rinse  15 mL Mouth Rinse BID   Continuous Infusions: . lactated ringers 10 mL/hr at 03/11/19 1229  . morphine 4 mg/hr (03/13/19 1350)     LOS: 6 days   Chriss Mannan  [facility]     03/14/2019, 3:19 PM

## 2019-03-14 NOTE — Plan of Care (Signed)

## 2019-03-14 NOTE — Progress Notes (Signed)
Discussed pacemaker with on call provider Webb Silversmith NP, received verbal order to apply magnet to pacemaker given patient is on comfort care measures.

## 2019-03-15 NOTE — Care Management Important Message (Signed)
Important Message  Patient Details  Name: Russell Patrick MRN: [id number] Date of Birth: January 05, 1933   Medicare Important Message Given:  Other (see comment)  Patient on comfort measures and out of respect for pt. and family no Important Message given.  [name] A Laquetta Racey 03/15/2019, 1:40 PM

## 2019-03-15 NOTE — Progress Notes (Signed)
Charted in Error

## 2019-03-15 NOTE — Progress Notes (Signed)
Progress Note    Russell Patrick  [id number] DOB: Aug 23, 1932  DOA: 03/11/2019 PCP: Marisa Hua, MD     Russell Patrick is an 86 y.o. male with multiple prior CVAs and resultant dysphagia, admitted with acute hypoxic respiratory failure due to aspiration pneumonia and acute encephalopathy due to severe hypoxia requiring intubation and mechanical ventilation.    Assessment/Plan:   Active Problems:   Acute respiratory failure ([place])   Community acquired pneumonia   Goals of care, counseling/discussion   Palliative care encounter  Acute hypoxemic respiratory failure secondary to aspiration pneumonia: Oxygen support as needed for comfort.  Patient is currently on comfort measures.  Continue IV morphine infusion for comfort.    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      Body mass index is 23.41 kg/m.   Family Communication/Anticipated D/C date and plan/Code Status   DVT prophylaxis: comfort care Code Status: DNR Family Communication: Plan discussed with Russell Patrick at the bedside. Disposition Plan: comfort care with hospice      Subjective:   Patient is non verbal and unable to provide any history  Objective:    Vitals:   03/13/19 0016 03/13/19 0737 03/14/19 0732 03/15/19 0831  BP: 135/66 102/65  (!) 147/88  Pulse: (!) 123 (!) 55 85 (!) 109  Resp: 16 19  20   Temp: 100.1 F (37.8 C) 98.1 F (36.7 C) 97.7 F (36.5 C) 98.6 F (37 C)  TempSrc: Oral Axillary Axillary Oral  SpO2: (!) 81% 92% 92% (!) 82%  Weight:      Height:        Intake/Output Summary (Last 24 hours) at 03/15/2019 1317 Last data filed at 03/15/2019 0500 Gross  per 24 hour  Intake 136.11 ml  Output 600 ml  Net -463.89 ml   Filed Weights   03/18/2019 0149 03/23/2019 1034  Weight: 82 kg 78.3 kg    Exam:  GEN: He is in mild to moderate respiratory distress SKIN: Warm and dry EYES: Pupils are minimally reactive to light  ENT: MMM CV: RRR PULM: More tachypneic today, bilateral rhonchi ABD: soft, ND, +BS CNS: unresponsive EXT: Swelling of bilateral upper extremities  Data Reviewed:   I have personally reviewed following labs and imaging studies:  Labs: Labs show the following:   Basic Metabolic Panel: Recent Labs  Lab 03/09/19 0429 03/10/19 0501  NA 143 146*  K 3.7 4.5  CL 109 112*  CO2 22 23  GLUCOSE 203* 169*  BUN 34* 42*  CREATININE 1.34* 1.33*  CALCIUM 8.5* 8.5*  MG 2.1 2.3  PHOS 3.8 4.4   GFR Estimated Creatinine Clearance: 45.4 mL/min (A) (by C-G formula based on SCr of 1.33 mg/dL (H)). Liver Function Tests: Recent Labs  Lab 03/09/19 0429  ALBUMIN 2.2*   No results for input(s): LIPASE, AMYLASE in the last 168 hours. No results for input(s): AMMONIA in the last 168 hours. Coagulation profile Recent Labs  Lab 03/09/19 0429  INR 2.0*    CBC: Recent Labs  Lab 03/09/19 0429 03/10/19 0501  WBC 26.3* 26.8*  NEUTROABS  --  23.4*  HGB 11.1* 11.3*  HCT 35.1* 36.0*  MCV 91.2 92.8  PLT 335 352  Cardiac Enzymes: No results for input(s): CKTOTAL, CKMB, CKMBINDEX, TROPONINI in the last 168 hours. BNP (last 3 results) No results for input(s): PROBNP in the last 8760 hours. CBG: Recent Labs  Lab 03/10/19 1945 03/11/19 0102 03/11/19 0418 03/11/19 0730 03/11/19 0736  GLUCAP 88 104* 132* 133* 76   D-Dimer: No results for input(s): DDIMER in the last 72 hours. Hgb A1c: No results for input(s): HGBA1C in the last 72 hours. Lipid Profile: No results for input(s): CHOL, HDL, LDLCALC, TRIG, CHOLHDL, LDLDIRECT in the last 72 hours. Thyroid function studies: No results for input(s): TSH, T4TOTAL, T3FREE,  THYROIDAB in the last 72 hours.  Invalid input(s): FREET3 Anemia work up: No results for input(s): VITAMINB12, FOLATE, FERRITIN, TIBC, IRON, RETICCTPCT in the last 72 hours. Sepsis Labs: Recent Labs  Lab 03/09/19 0429 03/10/19 0501  WBC 26.3* 26.8*    Microbiology Recent Results (from the past 240 hour(s))  Blood Culture (routine x 2)     Status: None   Collection Time: 03/30/2019  1:48 AM   Specimen: BLOOD LEFT HAND  Result Value Ref Range Status   Specimen Description BLOOD LEFT HAND  Final   Special Requests   Final    Blood Culture results may not be optimal due to an inadequate volume of blood received in culture bottles   Culture   Final    NO GROWTH 5 DAYS Performed at [facility], [address] [place], [place] [address]    Report Status 03/13/2019 FINAL  Final  Blood Culture (routine x 2)     Status: None   Collection Time: 03/28/2019  1:53 AM   Specimen: BLOOD LEFT HAND  Result Value Ref Range Status   Specimen Description BLOOD LEFT HAND  Final   Special Requests Blood Culture adequate volume  Final   Culture   Final    NO GROWTH 5 DAYS Performed at [facility], [address] [place], [place] [address]    Report Status 03/13/2019 FINAL  Final  Respiratory Panel by RT PCR (Flu A&B, Covid) - Nasopharyngeal Swab     Status: None   Collection Time: 04/04/2019  2:28 AM   Specimen: Nasopharyngeal Swab  Result Value Ref Range Status   SARS Coronavirus 2 by RT PCR NEGATIVE NEGATIVE Final    Comment: (NOTE) SARS-CoV-2 target nucleic acids are NOT DETECTED. The SARS-CoV-2 RNA is generally detectable in upper respiratoy specimens during the acute phase of infection. The lowest concentration of SARS-CoV-2 viral copies this assay can detect is 131 copies/mL. A negative result does not preclude SARS-Cov-2 infection and should not be used as the sole basis for treatment or other patient management decisions. A negative result may occur with  improper  specimen collection/handling, submission of specimen other than nasopharyngeal swab, presence of viral mutation(s) within the areas targeted by this assay, and inadequate number of viral copies (<131 copies/mL). A negative result must be combined with clinical observations, patient history, and epidemiological information. The expected result is Negative. Fact Sheet for Patients:  PinkCheek.be Fact Sheet for Healthcare Providers:  GravelBags.it This test is not yet ap proved or cleared by the [place] FDA and  has been authorized for detection and/or diagnosis of SARS-CoV-2 by FDA under an Emergency Use Authorization (EUA). This EUA will remain  in effect (meaning this test can be used) for the duration of the COVID-19 declaration under Section 564(b)(1) of the Act, 21 U.S.C. section 360bbb-3(b)(1), unless the authorization is terminated or revoked sooner.  Influenza A by PCR NEGATIVE NEGATIVE Final   Influenza B by PCR NEGATIVE NEGATIVE Final    Comment: (NOTE) The Xpert Xpress SARS-CoV-2/FLU/RSV assay is intended as an aid in  the diagnosis of influenza from Nasopharyngeal swab specimens and  should not be used as a sole basis for treatment. Nasal washings and  aspirates are unacceptable for Xpert Xpress SARS-CoV-2/FLU/RSV  testing. Fact Sheet for Patients: PinkCheek.be Fact Sheet for Healthcare Providers: GravelBags.it This test is not yet approved or cleared by the [place] FDA and  has been authorized for detection and/or diagnosis of SARS-CoV-2 by  FDA under an Emergency Use Authorization (EUA). This EUA will remain  in effect (meaning this test can be used) for the duration of the  Covid-19 declaration under Section 564(b)(1) of the Act, 21  U.S.C. section 360bbb-3(b)(1), unless the authorization is  terminated or revoked. Performed at [facility], [address] [place], [place] [address]   MRSA PCR Screening     Status: None   Collection Time: 03/26/2019  8:20 AM   Specimen: Nasopharyngeal  Result Value Ref Range Status   MRSA by PCR NEGATIVE NEGATIVE Final    Comment:        The GeneXpert MRSA Assay (FDA approved for NASAL specimens only), is one component of a comprehensive MRSA colonization surveillance program. It is not intended to diagnose MRSA infection nor to guide or monitor treatment for MRSA infections. Performed at [facility], [address] [place], [place] [address]     Procedures and diagnostic studies:  No results found.  Medications:   . chlorhexidine gluconate (MEDLINE KIT)  15 mL Mouth Rinse BID  . glycopyrrolate  0.6 mg Intravenous Q4H  . mouth rinse  15 mL Mouth Rinse BID   Continuous Infusions: . lactated ringers 10 mL/hr at 03/11/19 1229  . morphine 4 mg/hr (03/15/19 0300)     LOS: 7 days   Aldrin Engelhard  [facility]     03/15/2019, 1:17 PM

## 2019-04-05 NOTE — Progress Notes (Signed)
Daughter noticed pt is not breathing. Checked by RN. No heart beat noted. RN pronounced the death at 1050AM. MD notified.

## 2019-04-05 NOTE — Death Summary Note (Signed)
Death Summary      Russell Patrick MRN:[id number] DOB: 09/15/1932 DOA: 03/20/2019  PCP: Henry-Smith, Carol, MD   Admit date: 03/19/2019   Date of Death: 03/17/2019   Time of Death: 10:50 am   Final Diagnoses:   Principal Problem:   Community acquired pneumonia Active Problems:   Acute respiratory failure (HCC)   Goals of care, counseling/discussion   Palliative care encounter      Hospital Course:   Mr. Russell Patrickwas an 86 y.o.malewith multiple prior CVAs and resultant dysphagia, type 2 diabetes mellitus, history of DVT and pulmonary embolism status post IVC filter and status post permanent pacemaker placement.  He was admitted with acute hypoxic respiratory failure due to aspiration pneumonia and acute encephalopathy due to severe hypoxia requiring intubation and mechanical ventilation. He was not doing very well so his family requested that he be extubated and placed on comfort measures.    He was extubated and subsequently transferred out of the ICU for comfort measures.  His condition slowly deteriorated and he expired on 03/12/2019 at 10:50 AM.  His family was at the bedside at the time of his death.  I met with the family (son and 2 daughters) at the bedside to extend my condolences.  Death certificate has been signed.   Signed:  BERNARD AYIKU  [facility] 04/04/2019, 2:43 PM  

## 2019-04-05 DEATH — deceased

## 2021-10-11 IMAGING — DX DG CHEST 1V PORT
1 series · 1 of 1 positions shown · non-contrast
Comparison: 03/08/2019 [DATE].  04/08/2008.

CLINICAL DATA: Respiratory failure.

EXAM:
PORTABLE CHEST 1 VIEW

[chest ap]
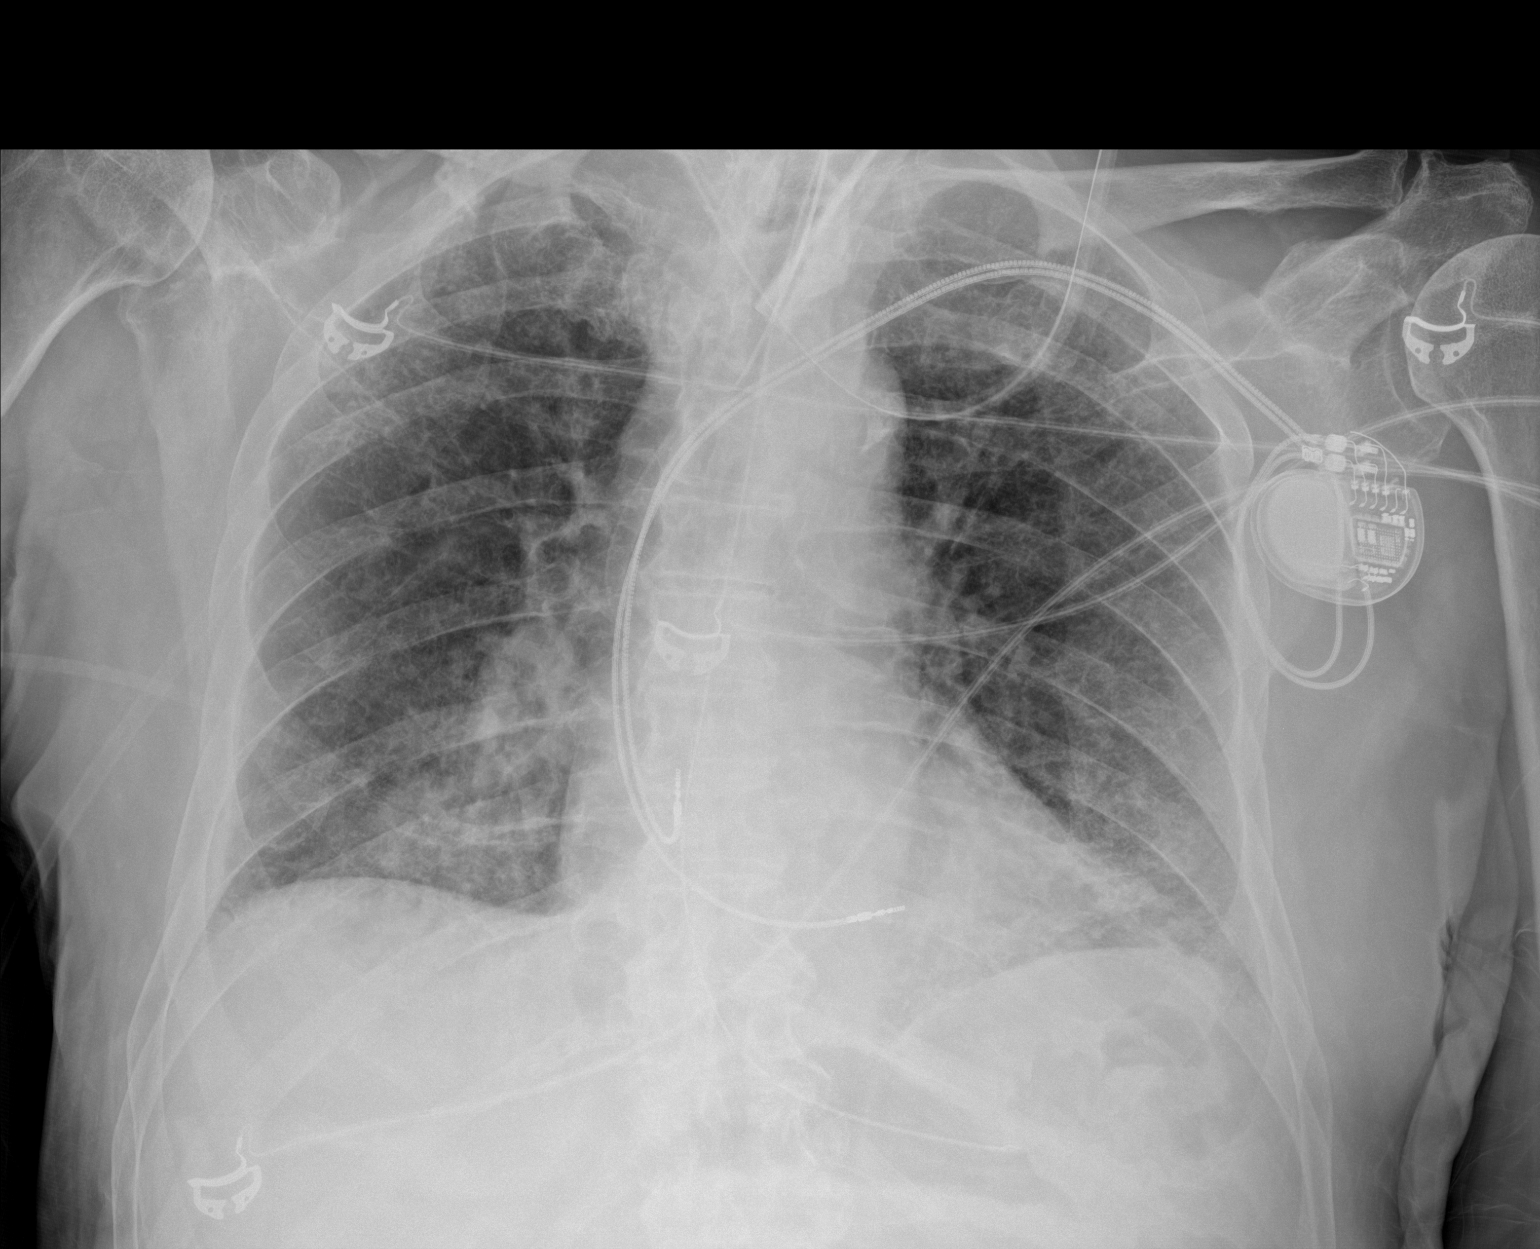

[1 of 1 positions shown; findings below may reference images not displayed]

FINDINGS: Endotracheal tube and NG tube in stable position. Cardiac pacer with
lead tips over the right atrium and right ventricle. Heart size
normal. Interstitial prominence is again noted bilaterally.
Interstitial process such as pneumonitis could present this fashion.
Slight improved aeration in the left lung base noted on today's
exam. No prominent pleural effusion. No pneumothorax.
IMPRESSION: 1.  Endotracheal tube and NG tube in stable position.

2.  Cardiac pacer stable position.  Heart size normal.

3. Diffuse bilateral interstitial infiltrates again noted. Slight
improvement in aeration in the left lung base noted on today's exam.
